# Patient Record
Sex: Male | Born: 1975
Health system: Southern US, Community
[De-identification: ages and names within clinical notes are randomized; demographics above are authoritative.]

## PROBLEM LIST (undated history)

## (undated) DIAGNOSIS — F1010 Alcohol abuse, uncomplicated: Secondary | ICD-10-CM

## (undated) DIAGNOSIS — R569 Unspecified convulsions: Secondary | ICD-10-CM

## (undated) HISTORY — PX: OTHER SURGICAL HISTORY: SHX169

## (undated) HISTORY — PX: HERNIA REPAIR: SHX51

## (undated) NOTE — *Deleted (*Deleted)
   07/10/20 2113  TOC ED Mini Assessment  TOC Time spent with patient (minutes): 25  PING Used in TOC Assessment No  Admission or Readmission Diverted Yes  Interventions which prevented an admission or readmission Medication Review;PCP Appointment Scheduled  What brought you to the Emergency Department?  seizures  Barrier interventions medication assistance, Assistance with PCP

---

## 1998-12-21 ENCOUNTER — Emergency Department (HOSPITAL_COMMUNITY): Admission: EM | Admit: 1998-12-21 | Discharge: 1998-12-21 | Payer: Self-pay | Admitting: Emergency Medicine

## 1998-12-22 ENCOUNTER — Encounter: Payer: Self-pay | Admitting: Internal Medicine

## 1998-12-22 ENCOUNTER — Ambulatory Visit (HOSPITAL_COMMUNITY): Admission: RE | Admit: 1998-12-22 | Discharge: 1998-12-22 | Payer: Self-pay | Admitting: Internal Medicine

## 1998-12-24 ENCOUNTER — Emergency Department (HOSPITAL_COMMUNITY): Admission: EM | Admit: 1998-12-24 | Discharge: 1998-12-24 | Payer: Self-pay | Admitting: Emergency Medicine

## 1999-01-02 ENCOUNTER — Ambulatory Visit (HOSPITAL_COMMUNITY): Admission: RE | Admit: 1999-01-02 | Discharge: 1999-01-02 | Payer: Self-pay | Admitting: Internal Medicine

## 1999-01-02 ENCOUNTER — Encounter: Payer: Self-pay | Admitting: Internal Medicine

## 1999-03-05 ENCOUNTER — Emergency Department (HOSPITAL_COMMUNITY): Admission: EM | Admit: 1999-03-05 | Discharge: 1999-03-05 | Payer: Self-pay | Admitting: *Deleted

## 1999-03-12 ENCOUNTER — Emergency Department (HOSPITAL_COMMUNITY): Admission: EM | Admit: 1999-03-12 | Discharge: 1999-03-12 | Payer: Self-pay | Admitting: Emergency Medicine

## 1999-09-20 ENCOUNTER — Emergency Department (HOSPITAL_COMMUNITY): Admission: EM | Admit: 1999-09-20 | Discharge: 1999-09-20 | Payer: Self-pay | Admitting: Emergency Medicine

## 1999-10-20 ENCOUNTER — Encounter: Payer: Self-pay | Admitting: Emergency Medicine

## 1999-10-20 ENCOUNTER — Emergency Department (HOSPITAL_COMMUNITY): Admission: EM | Admit: 1999-10-20 | Discharge: 1999-10-20 | Payer: Self-pay | Admitting: Emergency Medicine

## 1999-12-29 ENCOUNTER — Emergency Department (HOSPITAL_COMMUNITY): Admission: EM | Admit: 1999-12-29 | Discharge: 1999-12-29 | Payer: Self-pay

## 2000-01-22 ENCOUNTER — Encounter: Payer: Self-pay | Admitting: *Deleted

## 2000-01-22 ENCOUNTER — Emergency Department (HOSPITAL_COMMUNITY): Admission: EM | Admit: 2000-01-22 | Discharge: 2000-01-22 | Payer: Self-pay | Admitting: *Deleted

## 2000-04-09 ENCOUNTER — Emergency Department (HOSPITAL_COMMUNITY): Admission: EM | Admit: 2000-04-09 | Discharge: 2000-04-09 | Payer: Self-pay | Admitting: Emergency Medicine

## 2000-05-31 ENCOUNTER — Emergency Department (HOSPITAL_COMMUNITY): Admission: EM | Admit: 2000-05-31 | Discharge: 2000-05-31 | Payer: Self-pay | Admitting: Emergency Medicine

## 2000-07-01 ENCOUNTER — Emergency Department (HOSPITAL_COMMUNITY): Admission: EM | Admit: 2000-07-01 | Discharge: 2000-07-01 | Payer: Self-pay

## 2001-06-02 ENCOUNTER — Encounter: Payer: Self-pay | Admitting: Sports Medicine

## 2001-06-02 ENCOUNTER — Ambulatory Visit (HOSPITAL_COMMUNITY): Admission: RE | Admit: 2001-06-02 | Discharge: 2001-06-02 | Payer: Self-pay | Admitting: Sports Medicine

## 2001-09-13 ENCOUNTER — Emergency Department (HOSPITAL_COMMUNITY): Admission: EM | Admit: 2001-09-13 | Discharge: 2001-09-13 | Payer: Self-pay | Admitting: Emergency Medicine

## 2001-09-13 ENCOUNTER — Encounter: Payer: Self-pay | Admitting: Emergency Medicine

## 2003-04-13 ENCOUNTER — Emergency Department (HOSPITAL_COMMUNITY): Admission: EM | Admit: 2003-04-13 | Discharge: 2003-04-13 | Payer: Self-pay | Admitting: Emergency Medicine

## 2003-04-17 ENCOUNTER — Emergency Department (HOSPITAL_COMMUNITY): Admission: EM | Admit: 2003-04-17 | Discharge: 2003-04-17 | Payer: Self-pay | Admitting: Emergency Medicine

## 2003-09-16 ENCOUNTER — Emergency Department (HOSPITAL_COMMUNITY): Admission: EM | Admit: 2003-09-16 | Discharge: 2003-09-16 | Payer: Self-pay | Admitting: Emergency Medicine

## 2003-12-01 ENCOUNTER — Ambulatory Visit (HOSPITAL_BASED_OUTPATIENT_CLINIC_OR_DEPARTMENT_OTHER): Admission: RE | Admit: 2003-12-01 | Discharge: 2003-12-01 | Payer: Self-pay | Admitting: Orthopedic Surgery

## 2003-12-15 ENCOUNTER — Emergency Department (HOSPITAL_COMMUNITY): Admission: EM | Admit: 2003-12-15 | Discharge: 2003-12-15 | Payer: Self-pay | Admitting: Emergency Medicine

## 2009-07-10 ENCOUNTER — Emergency Department (HOSPITAL_COMMUNITY): Admission: EM | Admit: 2009-07-10 | Discharge: 2009-07-10 | Payer: Self-pay | Admitting: Emergency Medicine

## 2010-11-21 LAB — GC/CHLAMYDIA PROBE AMP, URINE: GC Probe Amp, Urine: NEGATIVE

## 2011-01-04 NOTE — Op Note (Signed)
Michael Hobbs, Michael Hobbs NO.:  [id number]   MEDICAL RECORD NO.:  [id number]                   PATIENT TYPE:  AMB   LOCATION:  DSC                                  FACILITY:  [facility]   PHYSICIAN:  Loreta Ave, M.D.              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   POSTOPERATIVE DIAGNOSES:  1. 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  2. Extensive tearing 50% integrity of the long head biceps tendon intra-     articular portion.   PROCEDURE:  1. Left shoulder examination under anesthesia.  2. Arthroscopy.  3. Debridement of rotator cuff above and below.  4. Debridement of extensive partial tearing along the biceps tendon.  5. Bursectomy.  6. Acromioplasty.  7. CA ligament release.  8. Excision distal clavicle.   SURGEON:  Loreta Ave, M.D.   ASSISTANT:  Arlys John D. Petrarca, P.A.-C.   ANESTHESIA:  General.   ESTIMATED BLOOD LOSS:  Minimal.   SPECIMENS:  None.   CULTURES:  None.   COMPLICATIONS:  None.   DRESSING:  Soft compressive with sling.   DESCRIPTION OF PROCEDURE:  The patient was brought to the operating room and  placed on the operating table in the supine position.  After adequate  anesthesia had been obtained, the left shoulder examined.  Full motion and  good stability.  Placed in the beach chair position on the shoulder  positioner.  Prepped and draped in the usual sterile fashion.  Three  standard portals - anterior, posterior, and lateral.  Shoulder entered with  blunt obturator, distended, and inspected.  Extensive partial thickness  tearing with delamination of the long head biceps tendon where about 50% of  the tendon was torn  longitudinally and a portion of this detached from the  glenoid.  All of this debrided back healthy tissue leaving more than 50% of  the tendon intact with a good anchor on the glenoid and still functional  going through the biceps hiatus.  I had enough left that I elected to leave  this in place and not resect it for an open tenodesis.  Partial thickness  tearing on the undersurface of the cuff debrided.  Some synovitis debrided.  Remaining articular cartilage, capsular ligamentous structures, and labrum  intact.  Cannuli redirected subacromially.  Type II to III acromion, obvious  subacromial impingement with a __________ on top of the cuff, and a  thickened bursa.  Cuff debrided, bursa resected.  Acromioplasty to a type I  acromion with shaver and high speed bur.  CA ligament released with cautery.  Distal clavicle exposed.  Osteolysis with grade IV  changes.  Lateral cm  sharply resected.  At completion, the entire cuff was debrided and  thoroughly inspected and intact with no full thickness tears.  Adequacy of  acromioplasty and distal clavicle excision confirmed view with small  portals.  The instruments and fluid removed.  The portals of the shoulder  and bursa injected with Marcaine.  Portals were closed with 4-0 nylon.  Sterile compressive dressing applied.  Anesthesia reversed.  Brought to the  recovery room.  Tolerated the procedure well with no complications.                                               Loreta Ave, M.D.    DFM/MEDQ  D:  12/01/2003  T:  12/02/2003  Job:  [id number]

## 2012-11-30 ENCOUNTER — Encounter (HOSPITAL_COMMUNITY): Payer: Self-pay | Admitting: *Deleted

## 2012-11-30 ENCOUNTER — Emergency Department (HOSPITAL_COMMUNITY): Payer: Medicaid - Out of State

## 2012-11-30 ENCOUNTER — Inpatient Hospital Stay (HOSPITAL_COMMUNITY)
Admission: EM | Admit: 2012-11-30 | Discharge: 2012-12-02 | DRG: 086 | Discharge status: Against Medical Advice | Payer: Medicaid - Out of State | Attending: Internal Medicine | Admitting: Internal Medicine

## 2012-11-30 DIAGNOSIS — D696 Thrombocytopenia, unspecified: Secondary | ICD-10-CM | POA: Diagnosis present

## 2012-11-30 DIAGNOSIS — F1011 Alcohol abuse, in remission: Secondary | ICD-10-CM | POA: Diagnosis present

## 2012-11-30 DIAGNOSIS — S022XXB Fracture of nasal bones, initial encounter for open fracture: Secondary | ICD-10-CM

## 2012-11-30 DIAGNOSIS — R569 Unspecified convulsions: Secondary | ICD-10-CM | POA: Diagnosis present

## 2012-11-30 DIAGNOSIS — R55 Syncope and collapse: Secondary | ICD-10-CM

## 2012-11-30 DIAGNOSIS — F10939 Alcohol use, unspecified with withdrawal, unspecified: Secondary | ICD-10-CM | POA: Diagnosis present

## 2012-11-30 DIAGNOSIS — F1020 Alcohol dependence, uncomplicated: Secondary | ICD-10-CM | POA: Diagnosis present

## 2012-11-30 DIAGNOSIS — F10239 Alcohol dependence with withdrawal, unspecified: Secondary | ICD-10-CM | POA: Diagnosis present

## 2012-11-30 DIAGNOSIS — Y92009 Unspecified place in unspecified non-institutional (private) residence as the place of occurrence of the external cause: Secondary | ICD-10-CM

## 2012-11-30 DIAGNOSIS — W19XXXA Unspecified fall, initial encounter: Secondary | ICD-10-CM | POA: Diagnosis present

## 2012-11-30 DIAGNOSIS — Z91199 Patient's noncompliance with other medical treatment and regimen due to unspecified reason: Secondary | ICD-10-CM

## 2012-11-30 DIAGNOSIS — Z823 Family history of stroke: Secondary | ICD-10-CM

## 2012-11-30 DIAGNOSIS — F17200 Nicotine dependence, unspecified, uncomplicated: Secondary | ICD-10-CM | POA: Diagnosis present

## 2012-11-30 DIAGNOSIS — S0512XA Contusion of eyeball and orbital tissues, left eye, initial encounter: Secondary | ICD-10-CM

## 2012-11-30 DIAGNOSIS — R030 Elevated blood-pressure reading, without diagnosis of hypertension: Secondary | ICD-10-CM | POA: Diagnosis present

## 2012-11-30 DIAGNOSIS — S066X9A Traumatic subarachnoid hemorrhage with loss of consciousness of unspecified duration, initial encounter: Secondary | ICD-10-CM

## 2012-11-30 DIAGNOSIS — Z9119 Patient's noncompliance with other medical treatment and regimen: Secondary | ICD-10-CM

## 2012-11-30 DIAGNOSIS — S0010XA Contusion of unspecified eyelid and periocular area, initial encounter: Secondary | ICD-10-CM | POA: Diagnosis present

## 2012-11-30 DIAGNOSIS — S022XXA Fracture of nasal bones, initial encounter for closed fracture: Secondary | ICD-10-CM

## 2012-11-30 HISTORY — DX: Alcohol abuse, uncomplicated: F10.10

## 2012-11-30 HISTORY — DX: Unspecified convulsions: R56.9

## 2012-11-30 LAB — POCT I-STAT TROPONIN I: Troponin i, poc: 0 ng/mL (ref 0.00–0.08)

## 2012-11-30 LAB — HEPATIC FUNCTION PANEL
Alkaline Phosphatase: 43 U/L (ref 39–117)
Indirect Bilirubin: 0.6 mg/dL (ref 0.3–0.9)
Total Bilirubin: 0.7 mg/dL (ref 0.3–1.2)
Total Protein: 7.6 g/dL (ref 6.0–8.3)

## 2012-11-30 LAB — COMPREHENSIVE METABOLIC PANEL WITH GFR
ALT: 28 U/L (ref 0–53)
AST: 60 U/L — ABNORMAL HIGH (ref 0–37)
Albumin: 4.4 g/dL (ref 3.5–5.2)
CO2: 29 meq/L (ref 19–32)
Calcium: 9.6 mg/dL (ref 8.4–10.5)
Chloride: 96 meq/L (ref 96–112)
GFR calc non Af Amer: >90 mL/min (ref >90)
Sodium: 138 meq/L (ref 135–145)

## 2012-11-30 LAB — CBC WITH DIFFERENTIAL/PLATELET
Basophils Absolute: 0 K/uL (ref 0.0–0.1)
Basophils Relative: 0 % (ref 0–1)
Eosinophils Absolute: 0 10*3/uL (ref 0.0–0.7)
Eosinophils Relative: 0 % (ref 0–5)
HCT: 38.8 % — ABNORMAL LOW (ref 39.0–52.0)
Hemoglobin: 13.8 g/dL (ref 13.0–17.0)
Lymphocytes Relative: 13 % (ref 12–46)
Lymphs Abs: 0.7 10*3/uL (ref 0.7–4.0)
MCH: 35.4 pg — ABNORMAL HIGH (ref 26.0–34.0)
MCHC: 35.6 g/dL (ref 30.0–36.0)
MCV: 99.5 fL (ref 78.0–100.0)
Monocytes Absolute: 0.5 K/uL (ref 0.1–1.0)
Monocytes Relative: 10 % (ref 3–12)
Neutro Abs: 4.2 10*3/uL (ref 1.7–7.7)
Neutrophils Relative %: 77 % (ref 43–77)
Platelets: 68 K/uL — ABNORMAL LOW (ref 150–400)
RBC: 3.9 MIL/uL — ABNORMAL LOW (ref 4.22–5.81)
RDW: 12.1 % (ref 11.5–15.5)
WBC: 5.4 K/uL (ref 4.0–10.5)

## 2012-11-30 LAB — ETHANOL: Alcohol, Ethyl (B): <11 mg/dL (ref 0–11)

## 2012-11-30 LAB — COMPREHENSIVE METABOLIC PANEL
Alkaline Phosphatase: 43 U/L (ref 39–117)
BUN: 5 mg/dL — ABNORMAL LOW (ref 6–23)
Creatinine, Ser: 0.82 mg/dL (ref 0.50–1.35)
GFR calc Af Amer: >90 mL/min (ref >90)
Glucose, Bld: 94 mg/dL (ref 70–99)
Potassium: 3.6 mEq/L (ref 3.5–5.1)
Total Bilirubin: 0.7 mg/dL (ref 0.3–1.2)
Total Protein: 7.6 g/dL (ref 6.0–8.3)

## 2012-11-30 LAB — APTT: aPTT: 34 seconds (ref 24–37)

## 2012-11-30 LAB — PROTIME-INR: Prothrombin Time: 13.5 seconds (ref 11.6–15.2)

## 2012-11-30 MED ORDER — THIAMINE HCL 100 MG/ML IJ SOLN
Freq: Once | INTRAVENOUS | Status: AC
  Administered 2012-11-30: 19:00:00 via INTRAVENOUS
  Filled 2012-11-30: qty 1000

## 2012-11-30 MED ORDER — MORPHINE SULFATE 4 MG/ML IJ SOLN
2.0000 mg | Freq: Once | INTRAMUSCULAR | Status: AC
  Administered 2012-11-30: 2 mg via INTRAVENOUS
  Filled 2012-11-30: qty 1

## 2012-11-30 MED ORDER — LORAZEPAM 2 MG/ML IJ SOLN
1.0000 mg | Freq: Once | INTRAMUSCULAR | Status: AC
  Administered 2012-11-30: 1 mg via INTRAVENOUS
  Filled 2012-11-30: qty 1

## 2012-11-30 NOTE — ED Notes (Signed)
Patient with facial swelling on left side and nasal swelling due to syncopal episode.  Patient also c/o neck pain and right shoulder pain.

## 2012-11-30 NOTE — ED Notes (Addendum)
Patient has history of seizures four years ago.  Does not take seizure medication.

## 2012-11-30 NOTE — Progress Notes (Signed)
Noted pt without pcp for follow up care CM spoke with pt and male family member at his bedside. Although pt is listed in [facility] with a [address] [place] [address] Address, CM was informed that the pt is visiting from [place] and will not need follow pcp. CM did offer a list of urgent care/primare care services in [place] for follow care prn

## 2012-11-30 NOTE — ED Notes (Signed)
Per EMS, pt from home, reports syncopal episode at 1030-1100 while in the kitchen.  Pt's family found pt on the floor at 1400 with blood.  Small abrasion noted on bridge of his nose.

## 2012-11-30 NOTE — ED Notes (Signed)
Patient transported to CT 

## 2012-11-30 NOTE — ED Notes (Addendum)
iSTat Trop 0.00, [facility] communication down.

## 2012-11-30 NOTE — ED Provider Notes (Signed)
History     CSN: [id number]  Arrival date & time 11/30/12  1437   First MD Initiated Contact with Patient 11/30/12 1750      Chief Complaint  Patient presents with  . Loss of Consciousness    (Consider location/radiation/quality/duration/timing/severity/associated sxs/prior treatment) HPI  Patient is a 37 year old male past medical history significant for alcohol abuse and alcohol withdrawal seizures (four years ago) presenting to ED after loss of consciousness without aura episode this morning while cooking. First thing patient remembers after he passed out is sitting on the couch waiting for EMS that his mother called. Family states found patient in bathroom cleaning himself up and he had dry blood on the front of his shirt and from his nose. And patient typically drinks 2 "large" Coors along with a pint of hard liquor daily for years. Patient has not had a drink since Saturday. Denies any recent illnesses, fevers, chills, nausea, vomiting, abdominal pain, chest pain, shortness of breath or cough. Denies recreational drug use.  Past Medical History  Diagnosis Date  . Alcohol abuse     History reviewed. No pertinent past surgical history.  No family history on file.  History  Substance Use Topics  . Smoking status: Current Every Day Smoker  . Smokeless tobacco: Not on file  . Alcohol Use: Yes      Review of Systems  Constitutional: Negative for fever and chills.  HENT: Positive for facial swelling, neck pain and neck stiffness. Negative for ear pain, sore throat and trouble swallowing.   Eyes: Positive for visual disturbance.  Respiratory: Negative for cough, chest tightness and shortness of breath.   Cardiovascular: Negative for chest pain and palpitations.  Gastrointestinal: Negative for nausea, vomiting and abdominal pain.  Genitourinary: Negative for dysuria and difficulty urinating.  Musculoskeletal: Positive for myalgias.  Skin: Positive for color change.    Neurological: Positive for headaches.  Psychiatric/Behavioral: Negative for confusion and decreased concentration. The patient is not nervous/anxious.     Allergies  Review of patient's allergies indicates no known allergies.  Home Medications  No current outpatient prescriptions on file.  BP 152/93  Pulse 85  Temp(Src) 99.2 F (37.3 C) (Oral)  Resp 16  SpO2 95%  Physical Exam  Constitutional: He is oriented to person, place, and time. He appears well-developed and well-nourished. No distress.  HENT:  Head: Normocephalic.    Right Ear: Hearing, tympanic membrane, external ear and ear canal normal.  Left Ear: Hearing, tympanic membrane, external ear and ear canal normal.  Nose: Nose lacerations present. No nasal deformity. No epistaxis.  Mouth/Throat: Uvula is midline, oropharynx is clear and moist and mucous membranes are normal. No oropharyngeal exudate.  Small abrasion bridge of nose not bleeding  Eyes:  Left eye is closed and d/t hematoma. Right eye: Pupils equal round reactive to light EOMI   Neck: Trachea normal and normal range of motion. Neck supple. Spinous process tenderness and muscular tenderness present. No rigidity. No tracheal deviation, no edema, no erythema and normal range of motion present.  Cardiovascular: Normal rate, regular rhythm, normal heart sounds and intact distal pulses.   Pulmonary/Chest: Effort normal and breath sounds normal. No respiratory distress. He has no wheezes. He exhibits no tenderness.  Abdominal: Soft. Bowel sounds are normal. There is no tenderness.  Musculoskeletal:       Right shoulder: He exhibits decreased range of motion, tenderness and pain. He exhibits no swelling, no deformity, normal pulse and normal strength.  Left shoulder: Normal.       Cervical back: He exhibits tenderness and bony tenderness. He exhibits normal range of motion.  Lymphadenopathy:    He has no cervical adenopathy.  Neurological: He is alert and  oriented to person, place, and time. He displays tremor. No sensory deficit. GCS eye subscore is 4. GCS verbal subscore is 5. GCS motor subscore is 6.  Skin: Skin is warm and dry. He is not diaphoretic.  Psychiatric: He has a normal mood and affect. His speech is normal and behavior is normal. Judgment and thought content normal. Cognition and memory are normal.    ED Course  Procedures (including critical care time)  Labs Reviewed  COMPREHENSIVE METABOLIC PANEL - Abnormal; Notable for the following:    BUN 5 (*)    AST 60 (*)    All other components within normal limits  ETHANOL  CBC WITH DIFFERENTIAL  HEPATIC FUNCTION PANEL  APTT  PROTIME-INR  POCT I-STAT TROPONIN I  POCT CBG (FASTING - GLUCOSE)-MANUAL ENTRY   Dg Facial Bones Complete  11/30/2012  *RADIOLOGY REPORT*  Clinical Data: 37 year old male with loss of consciousness.  Fall. Pain.  FACIAL BONES COMPLETE 3+V  Comparison: None.  Findings: Bone mineralization is within normal limits.  Paranasal sinuses appear normally pneumatized.  No abnormal intraorbital gas identified.  Mandible appears intact.  IMPRESSION: No plain radiographic evidence of acute facial fracture. If suspicion persists CT of the face would be more sensitive.   Original Report Authenticated By: Erskine Speed, M.D.    Dg Cervical Spine Complete  11/30/2012  *RADIOLOGY REPORT*  Clinical Data: 37 year old male with loss of consciousness.  Fall. Pain.  CERVICAL SPINE - COMPLETE 4+ VIEW  Comparison: None.  Findings: Reversed cervical lordosis.  Prevertebral soft tissue contour within normal limits. Cervicothoracic junction alignment is within normal limits.  Mild endplate spurring from C4 to the C6 level. Bilateral posterior element alignment is within normal limits.  AP alignment and lung apices within normal limits.  C1-C2 alignment and odontoid within normal limits.  IMPRESSION: No acute fracture or listhesis identified in the cervical spine. Ligamentous injury is not  excluded.   Original Report Authenticated By: Erskine Speed, M.D.    Dg Shoulder Right  11/30/2012  *RADIOLOGY REPORT*  Clinical Data: Fall, shoulder pain  RIGHT SHOULDER - 2+ VIEW  Comparison: None  Findings: No acute fracture or malalignment.  The humeral head is located with respect to the glenoid on the scapular Y view.  The acromioclavicular joint remains congruent.  No acute rib fracture identified.  Visualized lung is clear.  IMPRESSION: Negative right shoulder   Original Report Authenticated By: Malachy Moan, M.D.    Ct Head Wo Contrast  11/30/2012  *RADIOLOGY REPORT*  Clinical Data: Loss of consciousness.  CT HEAD WITHOUT CONTRAST  Technique:  Contiguous axial images were obtained from the base of the skull through the vertex without contrast.  Comparison: None  Findings: There is a small focal blood collection, most likely in the subarachnoid space around the left aspect of the brainstem near the tectum.  I do not see any other areas of subarachnoid hemorrhage or subdural hematoma.  The ventricles are normal in size and configuration.  The gray-white differentiation is maintained.  No acute skull fracture is identified.  There is a fracture of the bony nasal bridge.  There is soft tissue swelling around the left orbit.  The globes are intact.  The paranasal sinuses and mastoid air cells are clear  except for a small amount of fluid in the left maxillary sinus and in the right half of the sphenoid sinus.  IMPRESSION:  1.  Small focus of subarachnoid hemorrhage around the left side of the brainstem and tectum.  Recommend observation  and follow-up CT scan. 2.  No acute skull fracture. 3.  Nasal bone fractures.   Original Report Authenticated By: Rudie Meyer, M.D.      1. Subarachnoid hematoma, initial encounter   2. Syncope   3. Periorbital contusion of left eye, initial encounter   4. Nasal fracture, closed, initial encounter       MDM  Patient is a 37 year old male past medical history  significant for alcohol abuse presenting to ED after a loss of consciousness with fall that occurred earlier this afternoon. Loss of consciousness was unwitnessed, family noted patient cleaning himself up in the bathroom. Patient is in no neural focal deficits on examination, GCS 15, no other signs of neurologic damage. X-rays noted no acute fractures in the cervical spine were facial bones, no fracture or misalignment or dislocation noted on right shoulder x-ray. CT noncontrast of the head showed small focus of subarachnoid hemorrhage from left-sided brainstem with no acute skull fracture. As the fall was unwitnessed and the subarachnoid hemorrhage can not be determined if it caused the fall or was the result of the fall neurosurgery will be consulted. Hospitalist advised. Patient will be admitted for further evaluation and repeat CT of the head. Patient agreeable to plan. Patient d/w with Dr. Oletta Lamas, agrees with plan. Patient is stable and ready for transport.          Jeannetta Ellis, PA-C 12/01/12 0132

## 2012-12-01 ENCOUNTER — Inpatient Hospital Stay (HOSPITAL_COMMUNITY): Payer: Medicaid - Out of State

## 2012-12-01 ENCOUNTER — Other Ambulatory Visit (HOSPITAL_COMMUNITY): Payer: Self-pay

## 2012-12-01 ENCOUNTER — Encounter (HOSPITAL_COMMUNITY): Payer: Self-pay | Admitting: *Deleted

## 2012-12-01 DIAGNOSIS — S022XXB Fracture of nasal bones, initial encounter for open fracture: Secondary | ICD-10-CM

## 2012-12-01 DIAGNOSIS — R55 Syncope and collapse: Secondary | ICD-10-CM

## 2012-12-01 DIAGNOSIS — S022XXA Fracture of nasal bones, initial encounter for closed fracture: Secondary | ICD-10-CM

## 2012-12-01 DIAGNOSIS — S066X9A Traumatic subarachnoid hemorrhage with loss of consciousness of unspecified duration, initial encounter: Secondary | ICD-10-CM

## 2012-12-01 DIAGNOSIS — F1020 Alcohol dependence, uncomplicated: Secondary | ICD-10-CM

## 2012-12-01 DIAGNOSIS — F1011 Alcohol abuse, in remission: Secondary | ICD-10-CM | POA: Diagnosis present

## 2012-12-01 DIAGNOSIS — S0512XA Contusion of eyeball and orbital tissues, left eye, initial encounter: Secondary | ICD-10-CM

## 2012-12-01 DIAGNOSIS — S0510XA Contusion of eyeball and orbital tissues, unspecified eye, initial encounter: Secondary | ICD-10-CM

## 2012-12-01 DIAGNOSIS — S066XAA Traumatic subarachnoid hemorrhage with loss of consciousness status unknown, initial encounter: Secondary | ICD-10-CM

## 2012-12-01 HISTORY — DX: Traumatic subarachnoid hemorrhage with loss of consciousness of unspecified duration, initial encounter: S06.6X9A

## 2012-12-01 HISTORY — DX: Syncope and collapse: R55

## 2012-12-01 HISTORY — DX: Traumatic subarachnoid hemorrhage with loss of consciousness status unknown, initial encounter: S06.6XAA

## 2012-12-01 HISTORY — DX: Fracture of nasal bones, initial encounter for closed fracture: S02.2XXA

## 2012-12-01 HISTORY — DX: Contusion of eyeball and orbital tissues, left eye, initial encounter: S05.12XA

## 2012-12-01 LAB — CBC
Platelets: 63 10*3/uL — ABNORMAL LOW (ref 150–400)
RDW: 12.1 % (ref 11.5–15.5)
WBC: 5.3 10*3/uL (ref 4.0–10.5)

## 2012-12-01 LAB — BASIC METABOLIC PANEL
Calcium: 9.6 mg/dL (ref 8.4–10.5)
Chloride: 100 mEq/L (ref 96–112)
Creatinine, Ser: 0.9 mg/dL (ref 0.50–1.35)
GFR calc Af Amer: >90 mL/min (ref >90)

## 2012-12-01 LAB — MRSA PCR SCREENING: MRSA by PCR: NEGATIVE

## 2012-12-01 LAB — MAGNESIUM: Magnesium: 1.8 mg/dL (ref 1.5–2.5)

## 2012-12-01 MED ORDER — ACETAMINOPHEN 650 MG RE SUPP: 650.0000 mg | Freq: Four times a day (QID) | RECTAL | Status: DC | PRN

## 2012-12-01 MED ORDER — SODIUM CHLORIDE 0.9 % IV SOLN
INTRAVENOUS | Status: DC
  Administered 2012-12-01 (×2): via INTRAVENOUS

## 2012-12-01 MED ORDER — LORAZEPAM 1 MG PO TABS: 0.0000 mg | ORAL_TABLET | Freq: Two times a day (BID) | ORAL | Status: DC

## 2012-12-01 MED ORDER — SODIUM CHLORIDE 0.9 % IJ SOLN
3.0000 mL | Freq: Two times a day (BID) | INTRAMUSCULAR | Status: DC
  Administered 2012-12-01: 3 mL via INTRAVENOUS

## 2012-12-01 MED ORDER — HYDRALAZINE HCL 20 MG/ML IJ SOLN: 10.0000 mg | INTRAMUSCULAR | Status: DC | PRN

## 2012-12-01 MED ORDER — ACETAMINOPHEN 325 MG PO TABS
650.0000 mg | ORAL_TABLET | Freq: Four times a day (QID) | ORAL | Status: DC | PRN
  Administered 2012-12-01: 650 mg via ORAL
  Filled 2012-12-01: qty 2

## 2012-12-01 MED ORDER — ADULT MULTIVITAMIN W/MINERALS CH
1.0000 | ORAL_TABLET | Freq: Every day | ORAL | Status: DC
  Administered 2012-12-01: 1 via ORAL
  Filled 2012-12-01 (×2): qty 1

## 2012-12-01 MED ORDER — LORAZEPAM 2 MG/ML IJ SOLN
1.0000 mg | Freq: Four times a day (QID) | INTRAMUSCULAR | Status: DC | PRN
  Administered 2012-12-01: 1 mg via INTRAVENOUS
  Filled 2012-12-01: qty 1

## 2012-12-01 MED ORDER — FOLIC ACID 1 MG PO TABS
1.0000 mg | ORAL_TABLET | Freq: Every day | ORAL | Status: DC
  Administered 2012-12-01: 1 mg via ORAL
  Filled 2012-12-01 (×2): qty 1

## 2012-12-01 MED ORDER — IOHEXOL 350 MG/ML SOLN
50.0000 mL | Freq: Once | INTRAVENOUS | Status: AC | PRN
  Administered 2012-12-01: 50 mL via INTRAVENOUS

## 2012-12-01 MED ORDER — NICOTINE 7 MG/24HR TD PT24
7.0000 mg | MEDICATED_PATCH | Freq: Every day | TRANSDERMAL | Status: DC
  Administered 2012-12-01: 7 mg via TRANSDERMAL
  Filled 2012-12-01 (×3): qty 1

## 2012-12-01 MED ORDER — VITAMIN B-1 100 MG PO TABS
100.0000 mg | ORAL_TABLET | Freq: Every day | ORAL | Status: DC
  Administered 2012-12-01: 100 mg via ORAL
  Filled 2012-12-01 (×2): qty 1

## 2012-12-01 MED ORDER — ONDANSETRON HCL 4 MG PO TABS: 4.0000 mg | ORAL_TABLET | Freq: Four times a day (QID) | ORAL | Status: DC | PRN

## 2012-12-01 MED ORDER — LORAZEPAM 1 MG PO TABS: 1.0000 mg | ORAL_TABLET | Freq: Four times a day (QID) | ORAL | Status: DC | PRN

## 2012-12-01 MED ORDER — ONDANSETRON HCL 4 MG/2ML IJ SOLN: 4.0000 mg | Freq: Four times a day (QID) | INTRAMUSCULAR | Status: DC | PRN

## 2012-12-01 MED ORDER — LORAZEPAM 1 MG PO TABS
0.0000 mg | ORAL_TABLET | Freq: Four times a day (QID) | ORAL | Status: DC
  Administered 2012-12-01 (×2): 1 mg via ORAL
  Filled 2012-12-01: qty 1
  Filled 2012-12-01: qty 2

## 2012-12-01 MED ORDER — THIAMINE HCL 100 MG/ML IJ SOLN
100.0000 mg | Freq: Every day | INTRAMUSCULAR | Status: DC
  Filled 2012-12-01: qty 1

## 2012-12-01 NOTE — ED Provider Notes (Signed)
Medical screening examination/treatment/procedure(s) were conducted as a shared visit with non-physician practitioner(s) and myself.  I personally evaluated the patient during the encounter  Pt with un witnessed fall at home with family.  Pt found by family in bathroom trying to clean up.  Pt with obvious head injury from fall, contusion to periorbial region, swelling to bridge of nose.  Pt has no recollection of fall.  Pt with prior h/o alcohol withdrawal seizure.  Pt admits, hasn't had any drinks recently, ETOH is <11 here.  Head CT shows small amount of SAH.  Per Dr. Dutch Quint who reviewed CT scan, he recommends no surgical intervention needed, needs a repeat head CT in 24 hours to ensure no worsening is occurring.    Given possible seizure versus syncope, will admit to medicine and head CT can be repeated in the AM.  Pt can get outpt follow up with ENT/Maxilofacial as outpt for his nasal bone fractures.      Impression: Probably traumatic SAH Seizure versus syncope Facial contusions Nasal fractures   Gavin Pound. Oletta Lamas, MD 12/01/12 1610

## 2012-12-01 NOTE — Progress Notes (Signed)
I have seen and assessed the patient and agree with Dr Katherene Ponto assessment and plan. Will check a 2 d echo and carotid dopplers. Will consult neuro and transfer to [facility], per recommendations. Follow.

## 2012-12-01 NOTE — Consult Note (Signed)
Referring Physician: Janee Morn    Chief Complaint: SAH/SZ  HPI:                                                                                                                                         Michael Hobbs is an 37 y.o. male who had a seizure about 6 years ago. He describes that event as "family noted I was zoned out and not acting right. I was brought to the ED where they looked me over and then sent me home".  He denies any further workup or AED started at that time.He came to [place] to visit his mother and family  He states he drank the day of even (12 oz beer) and the day before he had a 60 oz beer and a shot hard alcohol. On the day of event he only recalls going into the kitchen cooking food. He then recalls his sister helping out of the bathroom and he had blood all over his face.  He currently would like to go home and feels there is no reason for him to stay. He is alert and oriented and follows all commands.   Date last known well: 4.14.14 Time last known well: unknown tPA Given: No: bleed  Past Medical History  Diagnosis Date  . Alcohol abuse     fell in 2006 and had "brain bleed"  . Seizures     Past Surgical History  Procedure Laterality Date  . Hernia repair    . Right shoulder surgery.      Family History  Problem Relation Age of Onset  . Stroke Father    -   Aneurysm                                                           Sister  Social History:  reports that he has been smoking Cigars.  He does not have any smokeless tobacco history on file. He reports that  drinks alcohol. He reports that he does not use illicit drugs.  Allergies: No Known Allergies  Medications:  Prior to Admission:  No prescriptions prior to admission   Scheduled: . folic acid  1 mg Oral Daily  . LORazepam  0-4 mg Oral Q6H   Followed by  . [START ON 12/03/2012]  LORazepam  0-4 mg Oral Q12H  . multivitamin with minerals  1 tablet Oral Daily  . sodium chloride  3 mL Intravenous Q12H  . thiamine  100 mg Oral Daily   Or  . thiamine  100 mg Intravenous Daily    ROS:                                                                                                                                       History obtained from the patient  General ROS: negative for - chills, fatigue, fever, night sweats, weight gain or weight loss Psychological ROS: negative for - behavioral disorder, hallucinations, memory difficulties, mood swings or suicidal ideation Ophthalmic ROS: negative for - blurry vision, double vision, eye pain or loss of vision ENT ROS: negative for - epistaxis, nasal discharge, oral lesions, sore throat, tinnitus or vertigo Allergy and Immunology ROS: negative for - hives or itchy/watery eyes Hematological and Lymphatic ROS: negative for - bleeding problems, bruising or swollen lymph nodes Endocrine ROS: negative for - galactorrhea, hair pattern changes, polydipsia/polyuria or temperature intolerance Respiratory ROS: negative for - cough, hemoptysis, shortness of breath or wheezing Cardiovascular ROS: negative for - chest pain, dyspnea on exertion, edema or irregular heartbeat Gastrointestinal ROS: negative for - abdominal pain, diarrhea, hematemesis, nausea/vomiting or stool incontinence Genito-Urinary ROS: negative for - dysuria, hematuria, incontinence or urinary frequency/urgency Musculoskeletal ROS: negative for - joint swelling or muscular weakness Neurological ROS: as noted in HPI Dermatological ROS: negative for rash and skin lesion changes  Neurologic Examination:                                                                                                      Blood pressure 149/89, pulse 89, temperature 99.6 F (37.6 C), temperature source Oral, resp. rate 19, height 6' (1.829 m), weight 79.6 kg (175 lb 7.8 oz), SpO2  96.00%.  Mental Status: Alert, oriented, thought content appropriate.  Speech fluent without evidence of aphasia.  Able to follow 3 step commands without difficulty. Cranial Nerves: II: Discs flat bilaterally (unable to see left eye due to swollen shut); Visual fields grossly normal, right pupil, round, reactive to light left pupil unable to visualize III,IV, VI: Left eye is swollen shut, extra-ocular motions left eye intact.  V,VII: smile asymmetric left face due to significant facial edema, facial light touch sensation normal bilaterally VIII: hearing normal bilaterally IX,X: gag reflex present XI: bilateral shoulder shrug XII: midline tongue extension Motor: Right : Upper extremity   5/5    Left:     Upper extremity   5/5  Lower extremity   5/5     Lower extremity   5/5 Tone and bulk:normal tone throughout; no atrophy noted Sensory: Pinprick and light touch intact throughout, bilaterally--He does show decreased cold sensation in bilateral mid calf to foot.  Deep Tendon Reflexes: 2+ and symmetric throughout UE, 1+ KJ and no AJ Plantars: Right: downgoing   Left: downgoing Cerebellar: normal finger-to-nose,  normal heel-to-shin test CV: pulses palpable throughout    Results for orders placed during the hospital encounter of 11/30/12 (from the past 48 hour(s))  CBC WITH DIFFERENTIAL     Status: Abnormal   Collection Time    11/30/12  4:40 PM      Result Value Range   WBC 5.4  4.0 - 10.5 K/uL   RBC 3.90 (*) 4.22 - 5.81 MIL/uL   Hemoglobin 13.8  13.0 - 17.0 g/dL   HCT 96.0 (*) 45.4 - 09.8 %   MCV 99.5  78.0 - 100.0 fL   MCH 35.4 (*) 26.0 - 34.0 pg   MCHC 35.6  30.0 - 36.0 g/dL   RDW 11.9  14.7 - 82.9 %   Platelets 68 (*) 150 - 400 K/uL   Comment: SPECIMEN CHECKED FOR CLOTS     REPEATED TO VERIFY   Neutrophils Relative 77  43 - 77 %   Lymphocytes Relative 13  12 - 46 %   Monocytes Relative 10  3 - 12 %   Eosinophils Relative 0  0 - 5 %   Basophils Relative 0  0 - 1 %    Neutro Abs 4.2  1.7 - 7.7 K/uL   Lymphs Abs 0.7  0.7 - 4.0 K/uL   Monocytes Absolute 0.5  0.1 - 1.0 K/uL   Eosinophils Absolute 0.0  0.0 - 0.7 K/uL   Basophils Absolute 0.0  0.0 - 0.1 K/uL   Smear Review PLATELET COUNT CONFIRMED BY SMEAR    COMPREHENSIVE METABOLIC PANEL     Status: Abnormal   Collection Time    11/30/12  4:40 PM      Result Value Range   Sodium 138  135 - 145 mEq/L   Potassium 3.6  3.5 - 5.1 mEq/L   Chloride 96  96 - 112 mEq/L   CO2 29  19 - 32 mEq/L   Glucose, Bld 94  70 - 99 mg/dL   BUN 5 (*) 6 - 23 mg/dL   Creatinine, Ser 5.62  0.50 - 1.35 mg/dL   Calcium 9.6  8.4 - 13.0 mg/dL   Total Protein 7.6  6.0 - 8.3 g/dL   Albumin 4.4  3.5 - 5.2 g/dL   AST 60 (*) 0 - 37 U/L   ALT 28  0 - 53 U/L   Alkaline Phosphatase 43  39 - 117 U/L   Total Bilirubin 0.7  0.3 - 1.2 mg/dL   GFR calc non Af Amer >90  >90 mL/min   GFR calc Af Amer >90  >90 mL/min   Comment:            The eGFR has been calculated     using the CKD EPI equation.     This calculation has not been  validated in all clinical     situations.     eGFR's persistently     <90 mL/min signify     possible Chronic Kidney Disease.  ETHANOL     Status: None   Collection Time    11/30/12  4:40 PM      Result Value Range   Alcohol, Ethyl (B) <11  0 - 11 mg/dL   Comment:            LOWEST DETECTABLE LIMIT FOR     SERUM ALCOHOL IS 11 mg/dL     FOR MEDICAL PURPOSES ONLY  POCT I-STAT TROPONIN I     Status: None   Collection Time    11/30/12  4:44 PM      Result Value Range   Troponin i, poc 0.00  0.00 - 0.08 ng/mL   Comment 3            Comment: Due to the release kinetics of cTnI,     a negative result within the first hours     of the onset of symptoms does not rule out     myocardial infarction with certainty.     If myocardial infarction is still suspected,     repeat the test at appropriate intervals.  HEPATIC FUNCTION PANEL     Status: Abnormal   Collection Time    11/30/12  7:20 PM       Result Value Range   Total Protein 7.6  6.0 - 8.3 g/dL   Albumin 4.5  3.5 - 5.2 g/dL   AST 60 (*) 0 - 37 U/L   ALT 28  0 - 53 U/L   Alkaline Phosphatase 43  39 - 117 U/L   Total Bilirubin 0.7  0.3 - 1.2 mg/dL   Bilirubin, Direct 0.1  0.0 - 0.3 mg/dL   Indirect Bilirubin 0.6  0.3 - 0.9 mg/dL  APTT     Status: None   Collection Time    11/30/12  7:20 PM      Result Value Range   aPTT 34  24 - 37 seconds  PROTIME-INR     Status: None   Collection Time    11/30/12  7:20 PM      Result Value Range   Prothrombin Time 13.5  11.6 - 15.2 seconds   INR 1.04  0.00 - 1.49  MRSA PCR SCREENING     Status: None   Collection Time    12/01/12  3:59 AM      Result Value Range   MRSA by PCR NEGATIVE  NEGATIVE   Comment:            The GeneXpert MRSA Assay (FDA     approved for NASAL specimens     only), is one component of a     comprehensive MRSA colonization     surveillance program. It is not     intended to diagnose MRSA     infection nor to guide or     monitor treatment for     MRSA infections.  MAGNESIUM     Status: None   Collection Time    12/01/12  5:00 AM      Result Value Range   Magnesium 1.8  1.5 - 2.5 mg/dL  BASIC METABOLIC PANEL     Status: Abnormal   Collection Time    12/01/12  5:10 AM      Result Value Range   Sodium 140  135 - 145  mEq/L   Potassium 3.6  3.5 - 5.1 mEq/L   Chloride 100  96 - 112 mEq/L   CO2 26  19 - 32 mEq/L   Glucose, Bld 91  70 - 99 mg/dL   BUN 4 (*) 6 - 23 mg/dL   Creatinine, Ser 1.61  0.50 - 1.35 mg/dL   Calcium 9.6  8.4 - 09.6 mg/dL   GFR calc non Af Amer >90  >90 mL/min   GFR calc Af Amer >90  >90 mL/min   Comment:            The eGFR has been calculated     using the CKD EPI equation.     This calculation has not been     validated in all clinical     situations.     eGFR's persistently     <90 mL/min signify     possible Chronic Kidney Disease.  CBC     Status: Abnormal   Collection Time    12/01/12  5:10 AM      Result Value  Range   WBC 5.3  4.0 - 10.5 K/uL   RBC 4.07 (*) 4.22 - 5.81 MIL/uL   Hemoglobin 14.5  13.0 - 17.0 g/dL   HCT 04.5  40.9 - 81.1 %   MCV 100.0  78.0 - 100.0 fL   MCH 35.6 (*) 26.0 - 34.0 pg   MCHC 35.6  30.0 - 36.0 g/dL   RDW 91.4  78.2 - 95.6 %   Platelets 63 (*) 150 - 400 K/uL   Comment: CONSISTENT WITH PREVIOUS RESULT   Dg Facial Bones Complete  11/30/2012  *RADIOLOGY REPORT*  Clinical Data: 37 year old male with loss of consciousness.  Fall. Pain.  FACIAL BONES COMPLETE 3+V  Comparison: None.  Findings: Bone mineralization is within normal limits.  Paranasal sinuses appear normally pneumatized.  No abnormal intraorbital gas identified.  Mandible appears intact.  IMPRESSION: No plain radiographic evidence of acute facial fracture. If suspicion persists CT of the face would be more sensitive.   Original Report Authenticated By: Erskine Speed, M.D.    Dg Cervical Spine Complete  11/30/2012  *RADIOLOGY REPORT*  Clinical Data: 37 year old male with loss of consciousness.  Fall. Pain.  CERVICAL SPINE - COMPLETE 4+ VIEW  Comparison: None.  Findings: Reversed cervical lordosis.  Prevertebral soft tissue contour within normal limits. Cervicothoracic junction alignment is within normal limits.  Mild endplate spurring from C4 to the C6 level. Bilateral posterior element alignment is within normal limits.  AP alignment and lung apices within normal limits.  C1-C2 alignment and odontoid within normal limits.  IMPRESSION: No acute fracture or listhesis identified in the cervical spine. Ligamentous injury is not excluded.   Original Report Authenticated By: Erskine Speed, M.D.    Dg Shoulder Right  11/30/2012  *RADIOLOGY REPORT*  Clinical Data: Fall, shoulder pain  RIGHT SHOULDER - 2+ VIEW  Comparison: None  Findings: No acute fracture or malalignment.  The humeral head is located with respect to the glenoid on the scapular Y view.  The acromioclavicular joint remains congruent.  No acute rib fracture identified.   Visualized lung is clear.  IMPRESSION: Negative right shoulder   Original Report Authenticated By: Malachy Moan, M.D.    Ct Head Wo Contrast  11/30/2012  *RADIOLOGY REPORT*  Clinical Data: Loss of consciousness.  CT HEAD WITHOUT CONTRAST  Technique:  Contiguous axial images were obtained from the base of the skull through the vertex without contrast.  Comparison: None  Findings: There is a small focal blood collection, most likely in the subarachnoid space around the left aspect of the brainstem near the tectum.  I do not see any other areas of subarachnoid hemorrhage or subdural hematoma.  The ventricles are normal in size and configuration.  The gray-white differentiation is maintained.  No acute skull fracture is identified.  There is a fracture of the bony nasal bridge.  There is soft tissue swelling around the left orbit.  The globes are intact.  The paranasal sinuses and mastoid air cells are clear except for a small amount of fluid in the left maxillary sinus and in the right half of the sphenoid sinus.  IMPRESSION:  1.  Small focus of subarachnoid hemorrhage around the left side of the brainstem and tectum.  Recommend observation  and follow-up CT scan. 2.  No acute skull fracture. 3.  Nasal bone fractures.   Original Report Authenticated By: Rudie Meyer, M.D.     Assessment and plan discussed with with attending physician and they are in agreement.    Felicie Morn PA-C [facility] [phone]  12/01/2012, 3:49 PM   Patient seen and examined.  Clinical course and management discussed.  Necessary edits performed.  I agree with the above.  Assessment and plan of care developed and discussed below.    Assessment: 37 y.o. male presenting after being found by family having a seizure.  Patient with history of ETOH.  Initial head CT reviewed and shows a small SAH on the left side of the brainstem.  Since patient found down unclear if this is traumatic or related to an aneurysm.  Also  unclear if the patient fell with resultant seizure or had a seizure initially.  Likely related to ETOH withdrawal.  Now slightly agitated.   Stroke Risk Factors - smoking  Plan: 1. HgbA1c, fasting lipid panel 2. MRI of the brain 3. PT consult, OT consult, Speech consult 4. CTA of the head tonight 5. Prophylactic therapy-None 6. BP control - target 160/90 or below 7. Telemetry monitoring 8. Frequent neuro checks 9. CIWA protocol  Case discussed with Dr. Lenda Kelp, MD [facility] [phone]  12/01/2012  4:16 PM

## 2012-12-01 NOTE — Progress Notes (Signed)
Report called to Tammy, RN at [facility] 3100. Pt informed of transfer and that carelink will be called.

## 2012-12-01 NOTE — Progress Notes (Signed)
   CARE MANAGEMENT NOTE 12/01/2012  Patient:  Michael Hobbs,Michael Hobbs   Account Number:  [id number]  Date Initiated:  12/01/2012  Documentation initiated by:  Jiles Crocker  Subjective/Objective Assessment:   ADMITTED WITH Subarachnoid hematoma     Action/Plan:   PATIENT IS VISITING  FROM NEW [place]; INDEPENDENT PRIOR TO ADMISSION; CM WILL CONTINUE TO FOLLOW FOR DCP   Anticipated DC Date:  12/05/2012   Anticipated DC Plan:  HOME/SELF CARE  In-house referral  Financial Counselor      DC Planning Services  CM consult             Status of service:  In process, will continue to follow Medicare Important Message given?  NA - LOS <3 / Initial given by admissions (If response is "NO", the following Medicare IM given date fields will be blank) Per UR Regulation:  Reviewed for med. necessity/level of care/duration of stay Comments:  12/01/2012- B Amardeep Beckers RN,BSN,MHA

## 2012-12-01 NOTE — Progress Notes (Signed)
  Echocardiogram 2D Echocardiogram has been performed.  Ellender Hose A 12/01/2012, 2:11 PM

## 2012-12-01 NOTE — Progress Notes (Signed)
Michael Hobbs to bedside to speak with pt and mother.  Explained to pt/mother that he is unable to leave floor and medical necessity of remaining in hospital.  Pt/mother verbalized understanding.  Pt agreeable to wearing monitor and having iv restarted.  Will continue to monitor.

## 2012-12-01 NOTE — Progress Notes (Signed)
Shift event: RN paged NP 2/2 pt leaving the unit to go smoke. Mother present at bedside and according to RN, went with him. Pt pulled his IV out and took off cardio leads and other lines.  NP to bedside to speak to pt. He was informed it is against policy for him to leave the unit, that he has a serious health issue to warrant him being in step down unit. I explained that for his safety, we need to have IV access and be able to monitor him. He agreed to stay and cooperate with care.  I informed pt that if he intends to leave the unit, he will have to sign out AMA. Security on unit with police but not needed at this time. Jimmye Norman, NP [facility]

## 2012-12-01 NOTE — Progress Notes (Signed)
Pt aggitated, states he wants to smoke a black.  Pulled iv out and cardiac monitor off.  Mother attempted to escort pt off floor to smoke.  Informed pt and mother that he could not leave unit.  Pt stopped at front desk by staff.  Pt back to room.  Pt mother insisting on pt being able to smoke and that he needs care.  Charge nurse sent to room to speak with pt and mother.  Craige Cotta notified of situation.  Order to keep iv out received.  Will attempt to keep pt in room.  Multiple attempts to explain situation to pt and mother.  Unable to keep telemetry on pt.

## 2012-12-01 NOTE — Progress Notes (Signed)
VASCULAR LAB PRELIMINARY  PRELIMINARY  PRELIMINARY  PRELIMINARY  Carotid duplex completed.    Preliminary report:  Bilateral:  No evidence of hemodynamically significant internal carotid artery stenosis.   Vertebral artery flow is antegrade.     Michael Hobbs, RVS 12/01/2012, 12:31 PM

## 2012-12-01 NOTE — H&P (Addendum)
[facility] History and Physical  Michael Hobbs VHQ:[id number] DOB: August 30, 1975 DOA: 11/30/2012  Referring physician: Dr.Ghim PCP: No primary provider on file.  Specialists: None.  Chief Complaint: Loss of consciousness.  HPI: Michael Hobbs is a 37 y.o. male who is visiting from [place] and lives with his family was found to be unconscious in his bathroom today by his family. Patient states does not recall the incident but may have had an early episode just few minutes before that when he may have fell in the hallway area. Patient was brought to the ER CT head showed small subarachnoid hemorrhage and nasal bone fracture on exam patient had left periorbital hematoma. On-call neurosurgeon Dr. Barnet Pall was consulted and at this time Dr. Barnet Pall stated there is no indication for any surgical requirement and has advised a repeat CT head in 24 hours. Patient presently is asymptomatic and denies any chest pain or shortness of breath. He states he drinks alcohol every day but last 2 days he has not had any because the liquor shops were closed on Sunday. Patient has had previous history of seizure probably related to alcohol withdrawal. EKG at this time shows normal sinus rhythm. Patient has been admitted for further observation.  Review of Systems: As presented in the history of presenting illness, rest negative.  Past Medical History  Diagnosis Date  . Alcohol abuse     fell in 2006 and had "brain bleed"  . Seizures    Past Surgical History  Procedure Laterality Date  . Hernia repair    . Right shoulder surgery.     Social History:  reports that he has been smoking Cigars.  He does not have any smokeless tobacco history on file. He reports that  drinks alcohol. He reports that he does not use illicit drugs. Patient is visiting from [place]. where does patient live-- Can do ADLs. Can patient participate in ADLs?  No Known Allergies  Family History  Problem Relation Age of Onset  .  Stroke Father       Prior to Admission medications   Not on File   Physical Exam: Filed Vitals:   12/01/12 0030 12/01/12 0130 12/01/12 0200 12/01/12 0300  BP: 147/92 151/88 151/99 150/98  Pulse: 85 83 84 96  Temp:    98.3 F (36.8 C)  TempSrc:    Axillary  Resp: 15 19 16 12   Height:    6' (1.829 m)  Weight:    78.1 kg (172 lb 2.9 oz)  SpO2: 96% 96% 95% 96%     General:  Well-developed and nourished.  Eyes: Left periorbital hematoma.  ENT: Nasal swelling with no active discharge.  Neck: No mass felt. No neck rigidity.  Cardiovascular: S1-S2 heard.  Respiratory: No rhonchi or crepitations.  Abdomen: Soft nontender bowel sounds present.  Skin: No rash.  Musculoskeletal: No edema.  Psychiatric: Appears normal. Denies any suicidal thoughts.  Neurologic: Alert awake oriented to time place and person. Moves all extremities 5 x 5.  Labs on Admission:  Basic Metabolic Panel:  Recent Labs Lab 11/30/12 1640  NA 138  K 3.6  CL 96  CO2 29  GLUCOSE 94  BUN 5*  CREATININE 0.82  CALCIUM 9.6   Liver Function Tests:  Recent Labs Lab 11/30/12 1640 11/30/12 1920  AST 60* 60*  ALT 28 28  ALKPHOS 43 43  BILITOT 0.7 0.7  PROT 7.6 7.6  ALBUMIN 4.4 4.5   No results found for this basename: LIPASE,  AMYLASE,  in the last 168 hours No results found for this basename: AMMONIA,  in the last 168 hours CBC:  Recent Labs Lab 11/30/12 1640  WBC 5.4  NEUTROABS 4.2  HGB 13.8  HCT 38.8*  MCV 99.5  PLT 68*   Cardiac Enzymes: No results found for this basename: CKTOTAL, CKMB, CKMBINDEX, TROPONINI,  in the last 168 hours  BNP (last 3 results) No results found for this basename: PROBNP,  in the last 8760 hours CBG: No results found for this basename: GLUCAP,  in the last 168 hours  Radiological Exams on Admission: Dg Facial Bones Complete  11/30/2012  *RADIOLOGY REPORT*  Clinical Data: 37 year old male with loss of consciousness.  Fall. Pain.  FACIAL BONES  COMPLETE 3+V  Comparison: None.  Findings: Bone mineralization is within normal limits.  Paranasal sinuses appear normally pneumatized.  No abnormal intraorbital gas identified.  Mandible appears intact.  IMPRESSION: No plain radiographic evidence of acute facial fracture. If suspicion persists CT of the face would be more sensitive.   Original Report Authenticated By: Erskine Speed, M.D.    Dg Cervical Spine Complete  11/30/2012  *RADIOLOGY REPORT*  Clinical Data: 37 year old male with loss of consciousness.  Fall. Pain.  CERVICAL SPINE - COMPLETE 4+ VIEW  Comparison: None.  Findings: Reversed cervical lordosis.  Prevertebral soft tissue contour within normal limits. Cervicothoracic junction alignment is within normal limits.  Mild endplate spurring from C4 to the C6 level. Bilateral posterior element alignment is within normal limits.  AP alignment and lung apices within normal limits.  C1-C2 alignment and odontoid within normal limits.  IMPRESSION: No acute fracture or listhesis identified in the cervical spine. Ligamentous injury is not excluded.   Original Report Authenticated By: Erskine Speed, M.D.    Dg Shoulder Right  11/30/2012  *RADIOLOGY REPORT*  Clinical Data: Fall, shoulder pain  RIGHT SHOULDER - 2+ VIEW  Comparison: None  Findings: No acute fracture or malalignment.  The humeral head is located with respect to the glenoid on the scapular Y view.  The acromioclavicular joint remains congruent.  No acute rib fracture identified.  Visualized lung is clear.  IMPRESSION: Negative right shoulder   Original Report Authenticated By: Malachy Moan, M.D.    Ct Head Wo Contrast  11/30/2012  *RADIOLOGY REPORT*  Clinical Data: Loss of consciousness.  CT HEAD WITHOUT CONTRAST  Technique:  Contiguous axial images were obtained from the base of the skull through the vertex without contrast.  Comparison: None  Findings: There is a small focal blood collection, most likely in the subarachnoid space around the  left aspect of the brainstem near the tectum.  I do not see any other areas of subarachnoid hemorrhage or subdural hematoma.  The ventricles are normal in size and configuration.  The gray-white differentiation is maintained.  No acute skull fracture is identified.  There is a fracture of the bony nasal bridge.  There is soft tissue swelling around the left orbit.  The globes are intact.  The paranasal sinuses and mastoid air cells are clear except for a small amount of fluid in the left maxillary sinus and in the right half of the sphenoid sinus.  IMPRESSION:  1.  Small focus of subarachnoid hemorrhage around the left side of the brainstem and tectum.  Recommend observation  and follow-up CT scan. 2.  No acute skull fracture. 3.  Nasal bone fractures.   Original Report Authenticated By: Rudie Meyer, M.D.     EKG: Independently reviewed.  Normal sinus rhythm.  Assessment/Plan Principal Problem:   Subarachnoid hematoma Active Problems:   Syncope   Nasal fracture   Periorbital contusion of left eye   Alcoholism   1. Traumatic subarachnoid hemorrhage around the left side of the brainstem and tectum - as per Dr. Dutch Quint on-call neurosurgeon there is no indication for any active surgical intervention. Repeat CT head in 24 hours. Neurochecks. 2. Syncope most likely secondary to withdrawal seizures - at this time I have placed patient on alcohol withdrawal protocol. Continue to monitor in telemetry. Check EEG. 3. Nasal bone fracture and periorbital hematoma on the left side - may need outpatient referral to maxillofacial surgeon. 4. Alcoholism and tobacco abuse - strongly advised to quit smoking and drinking alcohol. 5. Thrombocytopenia probably related to alcoholism - closely follow CBC. 6. Elevated blood pressure - closely for blood pressure trend. At this time had placed patient on when necessary IV hydralazine for systolic blood pressure more than 160.    Code Status: Full code.  Family  Communication: None.  Disposition Plan: Admit to inpatient.    Trimaine Maser N. [facility] Pager [phone].  If 7PM-7AM, please contact night-coverage [email or web address] Password [email or web address] 12/01/2012, 3:18 AM

## 2012-12-02 ENCOUNTER — Other Ambulatory Visit (HOSPITAL_COMMUNITY): Payer: Self-pay

## 2012-12-02 LAB — CBC
HCT: 36.7 % — ABNORMAL LOW (ref 39.0–52.0)
Hemoglobin: 13.5 g/dL (ref 13.0–17.0)
MCH: 35.4 pg — ABNORMAL HIGH (ref 26.0–34.0)
MCV: 96.3 fL (ref 78.0–100.0)
RBC: 3.81 MIL/uL — ABNORMAL LOW (ref 4.22–5.81)
WBC: 6.3 10*3/uL (ref 4.0–10.5)

## 2012-12-02 LAB — COMPREHENSIVE METABOLIC PANEL
AST: 41 U/L — ABNORMAL HIGH (ref 0–37)
Albumin: 4.4 g/dL (ref 3.5–5.2)
BUN: 5 mg/dL — ABNORMAL LOW (ref 6–23)
Creatinine, Ser: 0.87 mg/dL (ref 0.50–1.35)
Total Protein: 7.7 g/dL (ref 6.0–8.3)

## 2012-12-02 NOTE — Progress Notes (Signed)
Subjective: Patient typically drinks liquor on a daily basis(pint + 2 beers). He did not have a drink on Sunday and only had one on Monday when he had a seizure.   He states that he doesn't want an MRI or to stay another night. I advise him that he would be at risk of serious medical complications if he were to leave.   Exam: Filed Vitals:   12/01/12 2352  BP: 146/92  Pulse:   Temp: 98.5 F (36.9 C)  Resp:    Gen: In bed, NAD MS: Awake, Alert, oriented to hospital, month, year. Able to give # of quarteres in $2.75 CN:R pupil 3mm and reactive, EOMI intact, left eye swollen shut. Swelling of left face, but VII appears to be intact. VFF in left eye.  Motor: Moves all extremities well and equally.  Sensory:endorses symmetric sensation.  Gait: steady casual gait.   Impression: 37 yo M with head injury during seizure likely secondary to ETOH withdrawal. At this time, I feel that his [facility] is likely traumatic. I do not feel that aneurysmal bleeding is likely given the location and negative CTA. I would favor an MRI to both further investigate hemorrhage as well as rule out other possible etiologies for seizure.   Recommendations: 1) MRI brain 2) Continue CIWA protocol.  3) If patient desires to leave, it would be against medical advice.  4) No AED therapy recommended at this time other than PRN ativan per CIWA.   Ritta Slot, MD [facility] [phone]  If 7pm- 7am, please page neurology on call at [phone].

## 2012-12-02 NOTE — Progress Notes (Signed)
Pt signed paper t o leave against medical advise. Michael Hobbs

## 2012-12-02 NOTE — Care Management Note (Signed)
    Page 1 of 1   12/02/2012     10:35:18 AM   CARE MANAGEMENT NOTE 12/02/2012  Patient:  Sweda,Aleksei R   Account Number:  [id number]  Date Initiated:  12/01/2012  Documentation initiated by:  Jiles Crocker  Subjective/Objective Assessment:   ADMITTED WITH Subarachnoid hematoma     Action/Plan:   PATIENT IS VISITING [place] FROM [place]; INDEPENDENT PRIOR TO ADMISSION; CM WILL CONTINUE TO FOLLOW FOR DCP   Anticipated DC Date:  12/02/2012   Anticipated DC Plan:  AGAINST MEDICAL ADVICE  In-house referral  Financial Counselor      DC Planning Services  CM consult      Choice offered to / List presented to:             Status of service:  Completed, signed off Medicare Important Message given?  NA - LOS <3 / Initial given by admissions (If response is "NO", the following Medicare IM given date fields will be blank) Date Medicare IM given:   Date Additional Medicare IM given:    Discharge Disposition:  AGAINST MEDICAL ADVICE  Per UR Regulation:  Reviewed for med. necessity/level of care/duration of stay  If discussed at Long Length of Stay Meetings, dates discussed:    Comments:  12/01/2012- B CHANDLER RN,BSN,MHA

## 2012-12-03 ENCOUNTER — Emergency Department (HOSPITAL_COMMUNITY): Payer: Medicaid - Out of State

## 2012-12-03 ENCOUNTER — Emergency Department (HOSPITAL_COMMUNITY)
Admission: EM | Admit: 2012-12-03 | Discharge: 2012-12-03 | Disposition: A | Discharge status: Home / Self Care | Payer: Medicaid - Out of State | Attending: Emergency Medicine | Admitting: Emergency Medicine

## 2012-12-03 ENCOUNTER — Encounter (HOSPITAL_COMMUNITY): Payer: Self-pay | Admitting: *Deleted

## 2012-12-03 DIAGNOSIS — F17200 Nicotine dependence, unspecified, uncomplicated: Secondary | ICD-10-CM | POA: Insufficient documentation

## 2012-12-03 DIAGNOSIS — I609 Nontraumatic subarachnoid hemorrhage, unspecified: Secondary | ICD-10-CM | POA: Insufficient documentation

## 2012-12-03 DIAGNOSIS — S066X9D Traumatic subarachnoid hemorrhage with loss of consciousness of unspecified duration, subsequent encounter: Secondary | ICD-10-CM

## 2012-12-03 DIAGNOSIS — Z8669 Personal history of other diseases of the nervous system and sense organs: Secondary | ICD-10-CM | POA: Insufficient documentation

## 2012-12-03 DIAGNOSIS — Z87828 Personal history of other (healed) physical injury and trauma: Secondary | ICD-10-CM | POA: Insufficient documentation

## 2012-12-03 DIAGNOSIS — D696 Thrombocytopenia, unspecified: Secondary | ICD-10-CM | POA: Insufficient documentation

## 2012-12-03 LAB — COMPREHENSIVE METABOLIC PANEL
AST: 43 U/L — ABNORMAL HIGH (ref 0–37)
Albumin: 4.2 g/dL (ref 3.5–5.2)
Chloride: 98 mEq/L (ref 96–112)
Creatinine, Ser: 0.73 mg/dL (ref 0.50–1.35)
Potassium: 3.5 mEq/L (ref 3.5–5.1)
Total Bilirubin: 0.4 mg/dL (ref 0.3–1.2)
Total Protein: 7.9 g/dL (ref 6.0–8.3)

## 2012-12-03 LAB — CBC WITH DIFFERENTIAL/PLATELET
Basophils Absolute: 0 10*3/uL (ref 0.0–0.1)
Lymphocytes Relative: 19 % (ref 12–46)
Lymphs Abs: 0.9 10*3/uL (ref 0.7–4.0)
Monocytes Relative: 13 % — ABNORMAL HIGH (ref 3–12)
Platelets: DECREASED 10*3/uL (ref 150–400)
RDW: 12.1 % (ref 11.5–15.5)
WBC: 4.8 10*3/uL (ref 4.0–10.5)

## 2012-12-03 MED ORDER — LORAZEPAM 2 MG/ML IJ SOLN: 1.0000 mg | Freq: Four times a day (QID) | INTRAMUSCULAR | Status: DC | PRN

## 2012-12-03 MED ORDER — LORAZEPAM 1 MG PO TABS: 1.0000 mg | ORAL_TABLET | Freq: Four times a day (QID) | ORAL | Status: DC | PRN

## 2012-12-03 MED ORDER — HYDROMORPHONE HCL PF 1 MG/ML IJ SOLN
1.0000 mg | Freq: Once | INTRAMUSCULAR | Status: AC
  Administered 2012-12-03: 1 mg via INTRAVENOUS
  Filled 2012-12-03: qty 1

## 2012-12-03 MED ORDER — LORAZEPAM 1 MG PO TABS: 0.0000 mg | ORAL_TABLET | Freq: Two times a day (BID) | ORAL | Status: DC

## 2012-12-03 MED ORDER — NICOTINE 14 MG/24HR TD PT24
14.0000 mg | MEDICATED_PATCH | Freq: Every day | TRANSDERMAL | Status: DC
  Filled 2012-12-03: qty 1

## 2012-12-03 MED ORDER — ADULT MULTIVITAMIN W/MINERALS CH: 1.0000 | ORAL_TABLET | Freq: Every day | ORAL | Status: DC

## 2012-12-03 MED ORDER — LORAZEPAM 1 MG PO TABS: 0.0000 mg | ORAL_TABLET | Freq: Four times a day (QID) | ORAL | Status: DC

## 2012-12-03 MED ORDER — ONDANSETRON HCL 4 MG/2ML IJ SOLN
4.0000 mg | Freq: Once | INTRAMUSCULAR | Status: AC
  Administered 2012-12-03: 4 mg via INTRAVENOUS
  Filled 2012-12-03: qty 2

## 2012-12-03 MED ORDER — THIAMINE HCL 100 MG/ML IJ SOLN: 100.0000 mg | Freq: Every day | INTRAMUSCULAR | Status: DC

## 2012-12-03 MED ORDER — VITAMIN B-1 100 MG PO TABS: 100.0000 mg | ORAL_TABLET | Freq: Every day | ORAL | Status: DC

## 2012-12-03 MED ORDER — FOLIC ACID 1 MG PO TABS: 1.0000 mg | ORAL_TABLET | Freq: Every day | ORAL | Status: DC

## 2012-12-03 NOTE — ED Notes (Signed)
To ED for further eval of HA. Pt was in hospital a cple days ago after syncope at home and signed out AMA due to being on Ativan and not feeling well from that. States he now has a HA. No Vomiting.

## 2012-12-03 NOTE — ED Provider Notes (Signed)
I saw and evaluated the patient, reviewed the resident's note and I agree with the findings and plan.  Fall 3 days ago with SAH suspected to be traumatic.  Left AMA yesterday.  Returns with headache.  No new trauma.  Neuro intact.  Neurology recommending MRI. Recent CTA negative for aneurysm.  Glynn Octave, MD 12/03/12 [phone]

## 2012-12-03 NOTE — ED Provider Notes (Signed)
Mr and ct imaging reviewed by dr Amada Jupiter, neuro No acute issue currently.  At this point, nothing to be done about thrombocytopenia Otherwise stable for d/c home.  D/w patient and he is agreeable with this plan    Joya Gaskins, MD 12/03/12 1751

## 2012-12-03 NOTE — ED Provider Notes (Signed)
Plan is to f/u on MR imaging then speak to neurology Pt stable at this time BP 142/105  Pulse 84  Temp(Src) 98.1 F (36.7 C) (Oral)  Resp 20  SpO2 95%  Joya Gaskins, MD 12/03/12 1631

## 2012-12-03 NOTE — ED Provider Notes (Signed)
History     CSN: [id number]  Arrival date & time 12/03/12  1359   First MD Initiated Contact with Patient 12/03/12 1420      Chief Complaint  Patient presents with  . Headache    (Consider location/radiation/quality/duration/timing/severity/associated sxs/prior treatment) HPI  Michael Hobbs is a 37 y.o. male with history of alcohol abuse with withdrawal seizures recently diagnosed with subarachnoid hemorrhage admitted to inpatient from which he AMA yesterday because he states that he was confused. Patient presents back to the ED today to finish up his testing. States his pain is severe, 8/10, last alcohol intake was one beer, one hour prior to arrival. Patient denies chest pain, shortness of breath, abdominal pain, nausea vomiting, dysarthria, ataxia, facial droop, unilateral weakness, visual or sensory disturbance, meningismus, or falls since he left the hospital.   Past Medical History  Diagnosis Date  . Alcohol abuse     fell in 2006 and had "brain bleed"  . Seizures     Past Surgical History  Procedure Laterality Date  . Hernia repair    . Right shoulder surgery.      Family History  Problem Relation Age of Onset  . Stroke Father     History  Substance Use Topics  . Smoking status: Current Every Day Smoker -- 1.00 packs/day    Types: Cigars  . Smokeless tobacco: Not on file  . Alcohol Use: Yes     Comment: 1 pint of beer, and a 1/4 pint of liquor       Review of Systems  Constitutional: Negative for fever.  Respiratory: Negative for shortness of breath.   Cardiovascular: Negative for chest pain.  Gastrointestinal: Negative for nausea, vomiting, abdominal pain and diarrhea.  Neurological: Positive for headaches. Negative for syncope, facial asymmetry, speech difficulty, weakness, light-headedness and numbness.  All other systems reviewed and are negative.    Allergies  Review of patient's allergies indicates no known allergies.  Home Medications  No  current outpatient prescriptions on file.  BP 134/114  Pulse 95  Temp(Src) 98.1 F (36.7 C) (Oral)  Resp 20  SpO2 95%  Physical Exam  Nursing note and vitals reviewed. Constitutional: He is oriented to person, place, and time. He appears well-developed and well-nourished. No distress.  HENT:  Head: Normocephalic.    Mouth/Throat: Oropharynx is clear and moist.  Eyes: Conjunctivae and EOM are normal. Pupils are equal, round, and reactive to light.  Neck: Normal range of motion. Neck supple.  No midline tenderness to palpation or step-offs appreciated. Patient has full range of motion without pain.  Cardiovascular: Normal rate, regular rhythm and intact distal pulses.   Pulmonary/Chest: Effort normal and breath sounds normal. No stridor. No respiratory distress. He has no wheezes. He has no rales. He exhibits no tenderness.  Abdominal: Soft. Bowel sounds are normal. He exhibits no distension and no mass. There is no tenderness. There is no rebound and no guarding.  Musculoskeletal: Normal range of motion.  Neurological: He is alert and oriented to person, place, and time.  Cranial nerves III through XII intact, strength 5 out of 5x4 extremities, negative pronator drift, finger to nose and heel-to-shin coordinated, sensation intact to pinprick and light touch, gait is coordinated and Romberg is negative.   Psychiatric: He has a normal mood and affect.    ED Course  Procedures (including critical care time)  Labs Reviewed - No data to display Ct Angio Head W/cm &/or Wo Cm  12/01/2012  *RADIOLOGY REPORT*  Clinical Data:  37 year old male with syncope and fall.  Small volume subarachnoid hemorrhage. Agitated.  Seizure.  CT ANGIOGRAPHY HEAD AND NECK  Technique:  Multidetector CT imaging of the head and neck was performed using the standard protocol during bolus administration of intravenous contrast.  Multiplanar CT image reconstructions including MIPs were obtained to evaluate the  vascular anatomy. Carotid stenosis measurements (when applicable) are obtained utilizing NASCET criteria, using the distal internal carotid diameter as the denominator.  Contrast: 50mL OMNIPAQUE IOHEXOL 350 MG/ML SOLN  Comparison:  Head CT without contrast 11/30/2012.  CTA NECK  Findings:  Minimal dependent atelectasis.  No lymphadenopathy in the superior mediastinum.  Negative thyroid, larynx, pharynx, parapharyngeal spaces, retropharyngeal space, sublingual space, submandibular glands and parotid glands.  No cervical lymphadenopathy.  No acute osseous abnormality in the neck.  Face and orbit findings described below.  Vascular Findings: Three-vessel arch configuration with no arch or great vessel atherosclerosis.  Normal right CCA origin.  Normal right carotid bifurcation. Moderate to severely tortuous distal cervical right ICA.  Normal right vertebral artery origin.  Mildly dominant right vertebral artery.  Normal cervical right vertebral artery.  Normal left CCA origin.  Normal left carotid bifurcation.  Left ICA tortuosity similar to that on the right.  Mildly nondominant left vertebral artery has a normal origin. Intermittent tortuosity of the left vertebral artery which is otherwise within normal limits in the neck.   Review of the MIP images confirms the above findings.  IMPRESSION: 1.  Negative neck CTA aside from vessel tortuosity, especially at both distal cervical ICA. 2.  See intracranial findings below. 3.  No acute soft tissue findings identified in the neck.  CTA HEAD  Findings:  Trace bilateral subdural collections suspected, mildly increased since yesterday and hypodense.  Residual small volume subarachnoid hemorrhage in the left ambient cistern.  This has mildly decreased.  No additional subarachnoid hemorrhage.  No intraventricular hemorrhage.  Basilar cisterns remain patent.  No ventriculomegaly.  No midline shift. Gray-white matter differentiation is within normal limits throughout the brain.   No abnormal enhancement identified.  Visualized paranasal sinuses and mastoids are clear.  Left periorbital and scalp soft tissue hematoma.  Left greater than right nasal bone fractures.  Globes and other intraorbital soft tissues remain within normal limits.  No calvarium fracture identified.  Vascular Findings: Major intracranial venous structures are patent.  Mildly dominant distal right vertebral artery.  Normal right PICA. Normal vertebrobasilar junction.  Dominant left AICA.  No basilar stenosis.  SCA and PCA origins are within normal limits.  Small right posterior communicating artery.  The left is more diminutive or absent.  Bilateral PCA branches are within normal limits.  Both ICA siphons are patent without atherosclerosis or stenosis. On the right, there is a small 2-3 mm infundibulum of the right posterior communicating artery origin suspected (sagittal image 59).  Otherwise normal distal ICA.  Normal carotid termini.  MCA and ACA origins are within normal limits.  Normal anterior communicating artery.  ACA branches are within normal limits. Bilateral MCA branches are within normal limits.   Review of the MIP images confirms the above findings.  IMPRESSION: 1.  Small volume of subarachnoid hemorrhage in the left ambient cistern, mildly decreased from prior. 2.  Small post traumatic bilateral subdural hygromas or hypodense hematomas are suspected.  No significant mass effect. 3.  Negative intracranial CTA; a right posterior communicating artery origin infundibulum is present, with no convincing intracranial aneurysm or lesion identified to explain the site  of the subarachnoid hemorrhage. 4.  Left periorbital and scalp soft tissue injury.   Original Report Authenticated By: Erskine Speed, M.D.    Ct Head Wo Contrast  12/03/2012  *RADIOLOGY REPORT*  Clinical Data:  Headache.  CT HEAD WITHOUT CONTRAST CT MAXILLOFACIAL WITHOUT CONTRAST  Technique:  Multidetector CT imaging of the head and maxillofacial  structures were performed using the standard protocol without intravenous contrast. Multiplanar CT image reconstructions of the maxillofacial structures were also generated.  Comparison:  Head CT scan 11/30/2012.  CT angiogram head of 9th 12/01/2012.  CT HEAD  Findings: Tiny amount of hyperattenuating material along the left aspect of the ambient cisterna has decreased in volume.  No new hemorrhage is identified.  There may be small bilateral subdural hygromas, unchanged.  No midline shift, hydrocephalus, acute infarction, mass lesion or mass effect is identified.  No pneumocephalus or hydrocephalus.  The calvarium is intact.  IMPRESSION: Interval decrease in small amount of subarachnoid hemorrhage about the ambient cistern.  No new abnormality.  CT MAXILLOFACIAL  Findings:   There is soft tissue swelling about the face and eyes. The globes are intact and the lenses are located.  There may be a tiny chip fracture off the right nasal bone.  No other finding to suggest fracture is identified.  Visualized paranasal sinuses demonstrate minimal mucosal thickening in the left maxillary. Limited visualization of the cervical spine demonstrates multilevel spondylosis.  IMPRESSION: Soft tissue swelling about the base with a possible tiny chip fracture off the right nasal bone.  The study is otherwise negative.   Original Report Authenticated By: Holley Dexter, M.D.    Ct Angio Neck W/cm &/or Wo/cm  12/01/2012  *RADIOLOGY REPORT*  Clinical Data:  37 year old male with syncope and fall.  Small volume subarachnoid hemorrhage. Agitated.  Seizure.  CT ANGIOGRAPHY HEAD AND NECK  Technique:  Multidetector CT imaging of the head and neck was performed using the standard protocol during bolus administration of intravenous contrast.  Multiplanar CT image reconstructions including MIPs were obtained to evaluate the vascular anatomy. Carotid stenosis measurements (when applicable) are obtained utilizing NASCET criteria, using the  distal internal carotid diameter as the denominator.  Contrast: 50mL OMNIPAQUE IOHEXOL 350 MG/ML SOLN  Comparison:  Head CT without contrast 11/30/2012.  CTA NECK  Findings:  Minimal dependent atelectasis.  No lymphadenopathy in the superior mediastinum.  Negative thyroid, larynx, pharynx, parapharyngeal spaces, retropharyngeal space, sublingual space, submandibular glands and parotid glands.  No cervical lymphadenopathy.  No acute osseous abnormality in the neck.  Face and orbit findings described below.  Vascular Findings: Three-vessel arch configuration with no arch or great vessel atherosclerosis.  Normal right CCA origin.  Normal right carotid bifurcation. Moderate to severely tortuous distal cervical right ICA.  Normal right vertebral artery origin.  Mildly dominant right vertebral artery.  Normal cervical right vertebral artery.  Normal left CCA origin.  Normal left carotid bifurcation.  Left ICA tortuosity similar to that on the right.  Mildly nondominant left vertebral artery has a normal origin. Intermittent tortuosity of the left vertebral artery which is otherwise within normal limits in the neck.   Review of the MIP images confirms the above findings.  IMPRESSION: 1.  Negative neck CTA aside from vessel tortuosity, especially at both distal cervical ICA. 2.  See intracranial findings below. 3.  No acute soft tissue findings identified in the neck.  CTA HEAD  Findings:  Trace bilateral subdural collections suspected, mildly increased since yesterday and hypodense.  Residual small  volume subarachnoid hemorrhage in the left ambient cistern.  This has mildly decreased.  No additional subarachnoid hemorrhage.  No intraventricular hemorrhage.  Basilar cisterns remain patent.  No ventriculomegaly.  No midline shift. Gray-white matter differentiation is within normal limits throughout the brain.  No abnormal enhancement identified.  Visualized paranasal sinuses and mastoids are clear.  Left periorbital and  scalp soft tissue hematoma.  Left greater than right nasal bone fractures.  Globes and other intraorbital soft tissues remain within normal limits.  No calvarium fracture identified.  Vascular Findings: Major intracranial venous structures are patent.  Mildly dominant distal right vertebral artery.  Normal right PICA. Normal vertebrobasilar junction.  Dominant left AICA.  No basilar stenosis.  SCA and PCA origins are within normal limits.  Small right posterior communicating artery.  The left is more diminutive or absent.  Bilateral PCA branches are within normal limits.  Both ICA siphons are patent without atherosclerosis or stenosis. On the right, there is a small 2-3 mm infundibulum of the right posterior communicating artery origin suspected (sagittal image 59).  Otherwise normal distal ICA.  Normal carotid termini.  MCA and ACA origins are within normal limits.  Normal anterior communicating artery.  ACA branches are within normal limits. Bilateral MCA branches are within normal limits.   Review of the MIP images confirms the above findings.  IMPRESSION: 1.  Small volume of subarachnoid hemorrhage in the left ambient cistern, mildly decreased from prior. 2.  Small post traumatic bilateral subdural hygromas or hypodense hematomas are suspected.  No significant mass effect. 3.  Negative intracranial CTA; a right posterior communicating artery origin infundibulum is present, with no convincing intracranial aneurysm or lesion identified to explain the site of the subarachnoid hemorrhage. 4.  Left periorbital and scalp soft tissue injury.   Original Report Authenticated By: Erskine Speed, M.D.    Ct Maxillofacial Wo Cm  12/03/2012  *RADIOLOGY REPORT*  Clinical Data:  Headache.  CT HEAD WITHOUT CONTRAST CT MAXILLOFACIAL WITHOUT CONTRAST  Technique:  Multidetector CT imaging of the head and maxillofacial structures were performed using the standard protocol without intravenous contrast. Multiplanar CT image  reconstructions of the maxillofacial structures were also generated.  Comparison:  Head CT scan 11/30/2012.  CT angiogram head of 9th 12/01/2012.  CT HEAD  Findings: Tiny amount of hyperattenuating material along the left aspect of the ambient cisterna has decreased in volume.  No new hemorrhage is identified.  There may be small bilateral subdural hygromas, unchanged.  No midline shift, hydrocephalus, acute infarction, mass lesion or mass effect is identified.  No pneumocephalus or hydrocephalus.  The calvarium is intact.  IMPRESSION: Interval decrease in small amount of subarachnoid hemorrhage about the ambient cistern.  No new abnormality.  CT MAXILLOFACIAL  Findings:   There is soft tissue swelling about the face and eyes. The globes are intact and the lenses are located.  There may be a tiny chip fracture off the right nasal bone.  No other finding to suggest fracture is identified.  Visualized paranasal sinuses demonstrate minimal mucosal thickening in the left maxillary. Limited visualization of the cervical spine demonstrates multilevel spondylosis.  IMPRESSION: Soft tissue swelling about the base with a possible tiny chip fracture off the right nasal bone.  The study is otherwise negative.   Original Report Authenticated By: Holley Dexter, M.D.      1. Subarachnoid hematoma, subsequent encounter   2. Thrombocytopenia       MDM   Faith Rogue is a 37  y.o. male with alcohol abuse and traumatic subarachnoid hematoma diagnosed on the 15th. Patient signed out AMA yesterday. He comes back to the emergency room stating that he was confused and would like further workup.   Consult from hospitalist Dr. Benjamine Mola appreciated: She states she would like to get the MRI and ED. I have also spoken with neurologist Dr. Amada Jupiter who states that MRI as needed prior to disposition. From a neurologic standpoint, he states that if the MRI shows no concerning abnormalities he is fine to discharge the patient  home because it was explained to discharge him home at 48 hours after admission from his prior visit.  Case signed out to attending Dr. Shonna Chock line at shift change: Plan is to followup results of the MRI and speak with neurologist to advise on appropriate disposition.   Filed Vitals:   12/03/12 1403 12/03/12 1513  BP: 134/114 142/105  Pulse: 95 84  Temp: 98.1 F (36.7 C)   TempSrc: Oral   Resp: 20   SpO2: 95%       Wynetta Emery, PA-C 12/03/12 1635

## 2012-12-21 NOTE — Discharge Summary (Signed)
Date of admission: 11/30/12 Date of discharge: 12/01/12  Attending at the time of discharge: Maretta Bees (though patietn left AMA prior to being seen)  I was a Research scientist (medical) on the case, but did see the patient a single time on the day of discharge.   Principal diagnosis - Subarachnoid hemorrhage.  Secondary diagnosis - Seizure (likey due to EtOH withdrawal)  Procedures:  CT angio head 04/15 - 1. Small volume of subarachnoid hemorrhage in the left ambient  cistern, mildly decreased from prior.  2. Small post traumatic bilateral subdural hygromas or hypodense  hematomas are suspected. No significant mass effect.  3. Negative intracranial CTA; a right posterior communicating  artery origin infundibulum is present, with no convincing  intracranial aneurysm or lesion identified to explain the site of  the subarachnoid hemorrhage.  4. Left periorbital and scalp soft tissue injury.  CT angio neck 04/15 - 1. Negative neck CTA aside from vessel tortuosity, especially at  both distal cervical ICA.  2. See intracranial findings below.  3. No acute soft tissue findings identified in the neck.   Hospital Course: Patient was admitted with facial injury following a seizure presumed to be from alcohol withdrawal. On head CT, he was found to have a small perimesencephalic hemorrhage. He was admitted and had a CT angiogram performed which did not demonstrate any aneurysmal source of hemorrhage. He was to be observed further and have MR imaging to look for other potential causes of his blood, however the patient refused further treatment despite being able to clearly state the reasons for his hospitalization and potential risks related to leaving against medical advice. The patient left AMA.   Ritta Slot, MD [facility] [phone]  If 7pm- 7am, please page neurology on call at [phone].

## 2018-01-13 ENCOUNTER — Emergency Department (HOSPITAL_COMMUNITY)
Admission: EM | Admit: 2018-01-13 | Discharge: 2018-01-14 | Disposition: A | Discharge status: Home / Self Care | Payer: Self-pay | Attending: Emergency Medicine | Admitting: Emergency Medicine

## 2018-01-13 ENCOUNTER — Other Ambulatory Visit: Payer: Self-pay

## 2018-01-13 ENCOUNTER — Emergency Department (HOSPITAL_COMMUNITY): Payer: Self-pay

## 2018-01-13 ENCOUNTER — Encounter (HOSPITAL_COMMUNITY): Payer: Self-pay

## 2018-01-13 DIAGNOSIS — W010XXA Fall on same level from slipping, tripping and stumbling without subsequent striking against object, initial encounter: Secondary | ICD-10-CM | POA: Insufficient documentation

## 2018-01-13 DIAGNOSIS — F17290 Nicotine dependence, other tobacco product, uncomplicated: Secondary | ICD-10-CM | POA: Insufficient documentation

## 2018-01-13 DIAGNOSIS — R55 Syncope and collapse: Secondary | ICD-10-CM | POA: Insufficient documentation

## 2018-01-13 DIAGNOSIS — Y92512 Supermarket, store or market as the place of occurrence of the external cause: Secondary | ICD-10-CM | POA: Insufficient documentation

## 2018-01-13 DIAGNOSIS — Y990 Civilian activity done for income or pay: Secondary | ICD-10-CM | POA: Insufficient documentation

## 2018-01-13 DIAGNOSIS — Y939 Activity, unspecified: Secondary | ICD-10-CM | POA: Insufficient documentation

## 2018-01-13 DIAGNOSIS — S8392XA Sprain of unspecified site of left knee, initial encounter: Secondary | ICD-10-CM | POA: Insufficient documentation

## 2018-01-13 LAB — BASIC METABOLIC PANEL
ANION GAP: 12 (ref 5–15)
BUN: 8 mg/dL (ref 6–20)
CO2: 27 mmol/L (ref 22–32)
Calcium: 9.4 mg/dL (ref 8.9–10.3)
Chloride: 102 mmol/L (ref 101–111)
Creatinine, Ser: 0.93 mg/dL (ref 0.61–1.24)
GFR calc non Af Amer: >60 mL/min (ref >60)
GLUCOSE: 88 mg/dL (ref 65–99)
Potassium: 3.9 mmol/L (ref 3.5–5.1)
Sodium: 141 mmol/L (ref 135–145)

## 2018-01-13 LAB — URINALYSIS, ROUTINE W REFLEX MICROSCOPIC
BILIRUBIN URINE: NEGATIVE
Glucose, UA: NEGATIVE mg/dL
Hgb urine dipstick: NEGATIVE
KETONES UR: 5 mg/dL — AB
Leukocytes, UA: NEGATIVE
Nitrite: NEGATIVE
Protein, ur: NEGATIVE mg/dL
SPECIFIC GRAVITY, URINE: 1.028 (ref 1.005–1.030)
pH: 6 (ref 5.0–8.0)

## 2018-01-13 LAB — CBC
HCT: 41.6 % (ref 39.0–52.0)
HEMOGLOBIN: 14.1 g/dL (ref 13.0–17.0)
MCH: 34 pg (ref 26.0–34.0)
MCHC: 33.9 g/dL (ref 30.0–36.0)
MCV: 100.2 fL — ABNORMAL HIGH (ref 78.0–100.0)
Platelets: 187 10*3/uL (ref 150–400)
RBC: 4.15 MIL/uL — ABNORMAL LOW (ref 4.22–5.81)
RDW: 12.4 % (ref 11.5–15.5)
WBC: 6.1 10*3/uL (ref 4.0–10.5)

## 2018-01-13 NOTE — ED Triage Notes (Signed)
Pt presents with brief syncopal episode this morning.  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  Pt reports he awoke as soon as he hit the floor.  Pt reports he did not eat breakfast and reports 2 episodes of vomiting when he returned to work.  EMS was called but pt declined transport because his knees weren't as painful then.

## 2018-01-14 MED ORDER — IBUPROFEN 400 MG PO TABS
400.0000 mg | ORAL_TABLET | Freq: Once | ORAL | Status: AC | PRN
  Administered 2018-01-14: 400 mg via ORAL
  Filled 2018-01-14: qty 1

## 2018-01-14 MED ORDER — IBUPROFEN 400 MG PO TABS
400.0000 mg | ORAL_TABLET | Freq: Once | ORAL | Status: AC
  Administered 2018-01-14: 400 mg via ORAL
  Filled 2018-01-14: qty 1

## 2018-01-14 NOTE — ED Provider Notes (Signed)
Emergency Department Provider Note   I have reviewed the triage vital signs and the nursing notes.   HISTORY  Chief Complaint Fall   HPI Michael Hobbs is a 42 y.o. male who had decreased eating, drinking and sleeping yesterday and went to work. Every time he bent over to pick up a box and stood up he got light headed. After this happened a few times he had a near syncopal episode and fell to his knees and now knees hurt. hes not too worried about the syncope necessarily as he has had episodes like this in the past. He is more worried about the knee pain that has arisen and worsened since that time.  He does not remember twisting it but just feels like it has some type of deep bruise.  No chest pain, headache, palpitations, shortness of breath prior to the fall or near syncopal episode.  No history of heart problems or family history of early death.  No other associated or modifying symptoms.    Past Medical History:  Diagnosis Date  . Alcohol abuse    fell in 2006 and had "brain bleed"  . Seizures [facility])     Patient Active Problem List   Diagnosis Date Noted  . Syncope 12/01/2012  . Subarachnoid hematoma ([place]) 12/01/2012  . Nasal fracture 12/01/2012  . Periorbital contusion of left eye 12/01/2012  . Alcoholism ([place]) 12/01/2012    Past Surgical History:  Procedure Laterality Date  . HERNIA REPAIR    . right shoulder surgery.        Allergies Patient has no known allergies.  Family History  Problem Relation Age of Onset  . Stroke Father     Social History Social History   Tobacco Use  . Smoking status: Current Every Day Smoker    Packs/day: 1.00    Types: Cigars  . Smokeless tobacco: Never Used  Substance Use Topics  . Alcohol use: Yes    Comment: 1 pint of beer, and a 1/4 pint of liquor   . Drug use: No    Review of Systems  All other systems negative except as documented in the HPI. All pertinent positives and negatives as reviewed in the  HPI. ____________________________________________   PHYSICAL EXAM:  VITAL SIGNS: ED Triage Vitals  Enc Vitals Group     BP 01/13/18 1844 108/71     Pulse Rate 01/13/18 1844 (!) 57     Resp 01/13/18 1844 16     Temp 01/13/18 1844 98.5 F (36.9 C)     Temp Source 01/13/18 1844 Oral     SpO2 01/13/18 1844 97 %     Weight 01/13/18 1847 190 lb (86.2 kg)     Height 01/13/18 1847 6' (1.829 m)     Head Circumference --      Peak Flow --      Pain Score 01/13/18 1846 10     Pain Loc --      Pain Edu? --      Excl. in [place]? --     Constitutional: Alert and oriented. Well appearing and in no acute distress. Eyes: Conjunctivae are normal. PERRL. EOMI. Head: Atraumatic. Nose: No congestion/rhinnorhea. Mouth/Throat: Mucous membranes are moist.  Oropharynx non-erythematous. Neck: No stridor.  No meningeal signs.   Cardiovascular: Normal rate, regular rhythm. Good peripheral circulation. Grossly normal heart sounds.   Respiratory: Normal respiratory effort.  No retractions. Lungs CTAB. Gastrointestinal: Soft and nontender. No distention.  Musculoskeletal: No lower extremity tenderness  nor edema. No gross deformities of extremities. Mild l knee effusion. Neurologic:  Normal speech and language. No gross focal neurologic deficits are appreciated.  Skin:  Skin is warm, dry and intact. No rash noted.  ____________________________________________   LABS (all labs ordered are listed, but only abnormal results are displayed)  Labs Reviewed  CBC - Abnormal; Notable for the following components:      Result Value   RBC 4.15 (*)    MCV 100.2 (*)    All other components within normal limits  URINALYSIS, ROUTINE W REFLEX MICROSCOPIC - Abnormal; Notable for the following components:   Ketones, ur 5 (*)    All other components within normal limits  BASIC METABOLIC PANEL  CBG MONITORING, ED   ____________________________________________  EKG   EKG Interpretation  Date/Time:  Tuesday Jan 13 2018 18:55:21 EDT Ventricular Rate:  56 PR Interval:  150 QRS Duration: 74 QT Interval:  438 QTC Calculation: 422 R Axis:   51 Text Interpretation:  Sinus bradycardia Otherwise normal ECG No significant change since last tracing Confirmed by Merrily Pew [phone]) on 01/14/2018 7:48:24 AM       ____________________________________________  RADIOLOGY  Dg Knee 2 Views Left  Result Date: 01/13/2018 CLINICAL DATA:  42 year old male status post fall in convenience store today on concrete floor. Bilateral knee pain. EXAM: LEFT KNEE - 1-2 VIEW COMPARISON:  None. FINDINGS: Small suprapatellar joint effusion is evident on the 2nd lateral image. Joint spaces and alignment are maintained. The patella appears intact. No fracture or dislocation identified. IMPRESSION: Small joint effusion with no acute fracture or dislocation identified about the left knee. Electronically Signed   By: Genevie Ann M.D.   On: 01/13/2018 19:56   Dg Knee 2 Views Right  Result Date: 01/13/2018 CLINICAL DATA:  Fall EXAM: RIGHT KNEE - 1-2 VIEW COMPARISON:  None. FINDINGS: No fracture or dislocation is seen. The joint spaces are preserved. The visualized soft tissues are unremarkable. No suprapatellar knee joint effusion IMPRESSION: Negative. Electronically Signed   By: Julian Hy M.D.   On: 01/13/2018 19:56    ____________________________________________   PROCEDURES  Procedure(s) performed:   Procedures   ____________________________________________   INITIAL IMPRESSION / ASSESSMENT AND PLAN / ED COURSE  Suspect syncope is likely secondary to dehydration as it seemed to be with rapid movements from his squatting to standing.  No red flags or high risk features to suggest further work-up beyond his normal EKG and electrolytes. Knee pain is likely traumatic secondary to an effusion.  Plan will be to compression elevation ice and rest with anti-inflammatories.     Pertinent labs & imaging results that were  available during my care of the patient were reviewed by me and considered in my medical decision making (see chart for details).  ____________________________________________  FINAL CLINICAL IMPRESSION(S) / ED DIAGNOSES  Final diagnoses:  Sprain of left knee, unspecified ligament, initial encounter  Near syncope     MEDICATIONS GIVEN DURING THIS VISIT:  Medications  ibuprofen (ADVIL,MOTRIN) tablet 400 mg (400 mg Oral Given 01/14/18 0100)  ibuprofen (ADVIL,MOTRIN) tablet 400 mg (400 mg Oral Given 01/14/18 0816)     NEW OUTPATIENT MEDICATIONS STARTED DURING THIS VISIT:  New Prescriptions   No medications on file    Note:  This note was prepared with assistance of Dragon voice recognition software. Occasional wrong-word or sound-a-like substitutions may have occurred due to the inherent limitations of voice recognition software.   Dijon Kohlman, Corene Cornea, MD 01/14/18 0830

## 2018-01-14 NOTE — ED Notes (Signed)
Ace wrap placed. Cap refill wnl post placement. Discharge instructions reviewed and pt verbalizes understanding

## 2018-01-14 NOTE — Discharge Instructions (Addendum)
Apply a compressive ACE bandage. Rest and elevate the affected painful area.  Apply cold compresses intermittently as needed.  As pain recedes, begin normal activities slowly as tolerated.  Call if symptoms persist.

## 2018-03-11 ENCOUNTER — Other Ambulatory Visit: Payer: Self-pay

## 2018-03-11 ENCOUNTER — Emergency Department
Admission: EM | Admit: 2018-03-11 | Discharge: 2018-03-11 | Disposition: A | Discharge status: Home / Self Care | Payer: Self-pay | Attending: Emergency Medicine | Admitting: Emergency Medicine

## 2018-03-11 ENCOUNTER — Emergency Department: Payer: Self-pay

## 2018-03-11 DIAGNOSIS — Y998 Other external cause status: Secondary | ICD-10-CM | POA: Insufficient documentation

## 2018-03-11 DIAGNOSIS — Y939 Activity, unspecified: Secondary | ICD-10-CM | POA: Insufficient documentation

## 2018-03-11 DIAGNOSIS — Y33XXXA Other specified events, undetermined intent, initial encounter: Secondary | ICD-10-CM | POA: Insufficient documentation

## 2018-03-11 DIAGNOSIS — F1010 Alcohol abuse, uncomplicated: Secondary | ICD-10-CM | POA: Insufficient documentation

## 2018-03-11 DIAGNOSIS — Y9289 Other specified places as the place of occurrence of the external cause: Secondary | ICD-10-CM | POA: Insufficient documentation

## 2018-03-11 DIAGNOSIS — F17290 Nicotine dependence, other tobacco product, uncomplicated: Secondary | ICD-10-CM | POA: Insufficient documentation

## 2018-03-11 DIAGNOSIS — R569 Unspecified convulsions: Secondary | ICD-10-CM | POA: Insufficient documentation

## 2018-03-11 DIAGNOSIS — S161XXA Strain of muscle, fascia and tendon at neck level, initial encounter: Secondary | ICD-10-CM | POA: Insufficient documentation

## 2018-03-11 LAB — URINE DRUG SCREEN, QUALITATIVE (ARMC ONLY)
AMPHETAMINES, UR SCREEN: NOT DETECTED
BENZODIAZEPINE, UR SCRN: NOT DETECTED
Barbiturates, Ur Screen: NOT DETECTED
Cannabinoid 50 Ng, Ur ~~LOC~~: NOT DETECTED
Cocaine Metabolite,Ur ~~LOC~~: NOT DETECTED
MDMA (Ecstasy)Ur Screen: NOT DETECTED
METHADONE SCREEN, URINE: NOT DETECTED
Opiate, Ur Screen: NOT DETECTED
Phencyclidine (PCP) Ur S: NOT DETECTED
Tricyclic, Ur Screen: NOT DETECTED

## 2018-03-11 LAB — CBC
HEMATOCRIT: 43.2 % (ref 40.0–52.0)
HEMOGLOBIN: 15.2 g/dL (ref 13.0–18.0)
MCH: 36.1 pg — ABNORMAL HIGH (ref 26.0–34.0)
MCHC: 35.1 g/dL (ref 32.0–36.0)
MCV: 102.7 fL — ABNORMAL HIGH (ref 80.0–100.0)
Platelets: 87 10*3/uL — ABNORMAL LOW (ref 150–440)
RBC: 4.21 MIL/uL — ABNORMAL LOW (ref 4.40–5.90)
RDW: 13.1 % (ref 11.5–14.5)
WBC: 5.5 10*3/uL (ref 3.8–10.6)

## 2018-03-11 LAB — COMPREHENSIVE METABOLIC PANEL
ALBUMIN: 5.4 g/dL — AB (ref 3.5–5.0)
ALK PHOS: 43 U/L (ref 38–126)
ALT: 31 U/L (ref 0–44)
ANION GAP: 14 (ref 5–15)
AST: 71 U/L — AB (ref 15–41)
BUN: 10 mg/dL (ref 6–20)
CALCIUM: 10.2 mg/dL (ref 8.9–10.3)
CO2: 29 mmol/L (ref 22–32)
Chloride: 98 mmol/L (ref 98–111)
Creatinine, Ser: 1 mg/dL (ref 0.61–1.24)
GFR calc Af Amer: >60 mL/min (ref >60)
GFR calc non Af Amer: >60 mL/min (ref >60)
Glucose, Bld: 111 mg/dL — ABNORMAL HIGH (ref 70–99)
POTASSIUM: 3.6 mmol/L (ref 3.5–5.1)
Sodium: 141 mmol/L (ref 135–145)
TOTAL PROTEIN: 8.8 g/dL — AB (ref 6.5–8.1)
Total Bilirubin: 1 mg/dL (ref 0.3–1.2)

## 2018-03-11 LAB — ETHANOL

## 2018-03-11 MED ORDER — TETANUS-DIPHTH-ACELL PERTUSSIS 5-2.5-18.5 LF-MCG/0.5 IM SUSP: 0.5000 mL | Freq: Once | INTRAMUSCULAR | Status: DC

## 2018-03-11 MED ORDER — SODIUM CHLORIDE 0.9 % IV BOLUS
1000.0000 mL | Freq: Once | INTRAVENOUS | Status: AC
  Administered 2018-03-11: 1000 mL via INTRAVENOUS

## 2018-03-11 MED ORDER — ACETAMINOPHEN 500 MG PO TABS
ORAL_TABLET | ORAL | Status: AC
  Filled 2018-03-11: qty 2

## 2018-03-11 NOTE — ED Provider Notes (Signed)
[facility] Emergency Department Provider Note  ____________________________________________  Time seen: Approximately 6:41 PM  I have reviewed the triage vital signs and the nursing notes.   HISTORY  Chief Complaint Seizures   HPI Michael Hobbs is a 42 y.o. male with a history of a traumatic subarachnoid hemorrhage and alcohol abuse who presents for evaluation of seizure.  Patient reports this is his fifth seizures since having a subarachnoid hemorrhage in 2006.  He was seen by a neurologist with a negative EEG and was never placed on any medication.  Patient reports that he drinks a pint of liquor every day.  Last drink was yesterday evening.  Has had several seizures in the setting of not drinking for 1 day.  Patient reports that he was at work, he works at [employer], when he started feeling dizzy.  Per coworkers patient had a generalized tonic-clonic seizure and collapsed to the ground.  Patient had a short postictal phase and only remembers being on a stretcher here at the hospital.  Patient is complaining of sharp neck pain which has been constant since the seizure episode, midline, on the lower c-spine.  Patient denies back pain, facial pain, chest pain, extremity pain.  Patient denies urinary or bowel incontinence, tongue trauma.  Patient denies family history of seizure.  Patient denies drug use.   Past Medical History:  Diagnosis Date  . Alcohol abuse    fell in 2006 and had "brain bleed"  . Seizures [facility])     Patient Active Problem List   Diagnosis Date Noted  . Syncope 12/01/2012  . Subarachnoid hematoma ([place]) 12/01/2012  . Nasal fracture 12/01/2012  . Periorbital contusion of left eye 12/01/2012  . Alcoholism ([place]) 12/01/2012    Past Surgical History:  Procedure Laterality Date  . HERNIA REPAIR    . right shoulder surgery.      Prior to Admission medications   Not on File    Allergies Patient has no known allergies.  Family  History  Problem Relation Age of Onset  . Stroke Father     Social History Social History   Tobacco Use  . Smoking status: Current Every Day Smoker    Packs/day: 1.00    Types: Cigars  . Smokeless tobacco: Never Used  Substance Use Topics  . Alcohol use: Yes    Comment: 1 pint of beer, and a 1/4 pint of liquor   . Drug use: No    Review of Systems Constitutional: Negative for fever. + dizziness Eyes: Negative for visual changes. ENT: + facial injury and neck pain Cardiovascular: Negative for chest injury. Respiratory: Negative for shortness of breath. Negative for chest wall injury. Gastrointestinal: Negative for abdominal pain or injury. Genitourinary: Negative for dysuria. Musculoskeletal: Negative for back injury, negative for arm or leg pain. Skin: Negative for laceration/abrasions. Neurological: + head injury, seizure   ____________________________________________   PHYSICAL EXAM:  VITAL SIGNS: ED Triage Vitals  Enc Vitals Group     BP 03/11/18 1814 (!) 128/113     Pulse Rate 03/11/18 1814 84     Resp 03/11/18 1814 19     Temp 03/11/18 1814 97.8 F (36.6 C)     Temp Source 03/11/18 1814 Oral     SpO2 03/11/18 1814 100 %     Weight 03/11/18 1815 185 lb (83.9 kg)     Height 03/11/18 1815 6' (1.829 m)     Head Circumference --      Peak Flow --  Pain Score 03/11/18 1814 8     Pain Loc --      Pain Edu? --      Excl. in [place]? --     Constitutional: Alert and oriented. No acute distress. Does not appear intoxicated. HEENT Head: Normocephalic and atraumatic. Face: No facial bony tenderness. Stable midface Ears: No hemotympanum bilaterally. No Battle sign Eyes: No eye injury. PERRL. No raccoon eyes. R lower periorbital abrasion. R eyebrow abrasion Nose: Nontender. No epistaxis. No rhinorrhea Mouth/Throat: Mucous membranes are moist. No oropharyngeal blood. No dental injury. Airway patent without stridor. Normal voice. Neck: C-collar in place. Lower  midline c-spine tenderness. No stepoffs Cardiovascular: Normal rate, regular rhythm. Normal and symmetric distal pulses are present in all extremities. Pulmonary/Chest: Chest wall is stable and nontender to palpation/compression. Normal respiratory effort. Breath sounds are normal. No crepitus.  Abdominal: Soft, nontender, non distended. Musculoskeletal: Nontender with normal full range of motion in all extremities. No deformities. No thoracic or lumbar midline spinal tenderness. Pelvis is stable. Skin: Skin is warm, dry and intact. No abrasions or contutions. Psychiatric: Speech and behavior are appropriate. Neurological: Normal speech and language. Moves all extremities to command. No gross focal neurologic deficits are appreciated.  Glascow Coma Score: 4 - Opens eyes on own 6 - Follows simple motor commands 5 - Alert and oriented GCS: 15  ____________________________________________   LABS (all labs ordered are listed, but only abnormal results are displayed)  Labs Reviewed  CBC - Abnormal; Notable for the following components:      Result Value   RBC 4.21 (*)    MCV 102.7 (*)    MCH 36.1 (*)    Platelets 87 (*)    All other components within normal limits  COMPREHENSIVE METABOLIC PANEL - Abnormal; Notable for the following components:   Glucose, Bld 111 (*)    Total Protein 8.8 (*)    Albumin 5.4 (*)    AST 71 (*)    All other components within normal limits  ETHANOL  URINE DRUG SCREEN, QUALITATIVE ([facility] ONLY)  CBG MONITORING, ED   ____________________________________________  EKG  ED ECG REPORT I, Rudene Re, the attending physician, personally viewed and interpreted this ECG.   Normal sinus rhythm, rate of 86, normal intervals, LVH, normal axis, no ST elevations or depressions.  Unchanged from prior ____________________________________________  RADIOLOGY  I have personally reviewed the images performed during this visit and I agree with the Radiologist's  read.   Interpretation by Radiologist:  Ct Head Wo Contrast  Result Date: 03/11/2018 CLINICAL DATA:  Injury sustained from fall related to seizure. EXAM: CT HEAD WITHOUT CONTRAST CT MAXILLOFACIAL WITHOUT CONTRAST CT CERVICAL SPINE WITHOUT CONTRAST TECHNIQUE: Multidetector CT imaging of the head, cervical spine, and maxillofacial structures were performed using the standard protocol without intravenous contrast. Multiplanar CT image reconstructions of the cervical spine and maxillofacial structures were also generated. COMPARISON:  Head CT dated 12/03/2012 and brain MRI dated 12/03/2012. FINDINGS: CT HEAD FINDINGS Brain: Ventricles are within normal limits. All areas of the brain demonstrate appropriate gray-white matter differentiation. There is no mass, hemorrhage, edema or other evidence of acute parenchymal abnormality. No extra-axial hemorrhage. Vascular: No hyperdense vessel or unexpected calcification. Skull: Normal. Negative for fracture or focal lesion. Other: Mild scalp edema overlying the LEFT frontoparietal bone. No underlying fracture. CT MAXILLOFACIAL FINDINGS Osseous: Lower frontal bones are intact. Slight deformity of the anterior nasal bones, of uncertain age, suspect chronic. Osseous structures about the orbits are intact and normally aligned bilaterally.  Walls of the maxillary sinuses are intact and normally aligned bilaterally. Bilateral zygomatic arches and pterygoid plates are intact. No mandible fracture or displacement seen. Orbits: Negative. No traumatic or inflammatory finding. Sinuses: Mild mucosal thickening within the LEFT maxillary sinus. Otherwise clear. Soft tissues: Negative. CT CERVICAL SPINE FINDINGS Alignment: There is kyphosis of the cervical spine, reversal of the normal cervical spine lordosis, presumably chronic with a similar appearance to earlier CT from 2014. Skull base and vertebrae: No fracture line or displaced fracture fragment seen. Facet joints appear intact and  normally aligned throughout. Soft tissues and spinal canal: No prevertebral fluid or swelling. No visible canal hematoma. Disc levels: Degenerative spondylosis throughout the mid and lower cervical spine, mild to moderate in degree with mild disc space narrowings and osseous spurring. Associated mild to moderate central canal stenoses at the C3-4 and C4-5 levels, likely accentuated by the cervical kyphosis. Upper chest: No acute findings. Emphysematous blebs at the lung apices. Other: Bilateral carotid atherosclerosis. IMPRESSION: 1. Mild scalp edema overlying the LEFT frontoparietal bone. No underlying fracture. 2. No acute intracranial abnormality. No intracranial mass, hemorrhage or edema. 3. Slight deformity of the anterior nasal bones, of uncertain age but favored to be chronic. No other facial bone fracture or displacement seen. 4. No fracture or acute subluxation within the cervical spine. Chronic/degenerative changes of the cervical spine, as detailed above. 5. Emphysematous changes within the lung apices. 6. Carotid atherosclerosis. Electronically Signed   By: Franki Cabot M.D.   On: 03/11/2018 19:18   Ct Cervical Spine Wo Contrast  Result Date: 03/11/2018 CLINICAL DATA:  Injury sustained from fall related to seizure. EXAM: CT HEAD WITHOUT CONTRAST CT MAXILLOFACIAL WITHOUT CONTRAST CT CERVICAL SPINE WITHOUT CONTRAST TECHNIQUE: Multidetector CT imaging of the head, cervical spine, and maxillofacial structures were performed using the standard protocol without intravenous contrast. Multiplanar CT image reconstructions of the cervical spine and maxillofacial structures were also generated. COMPARISON:  Head CT dated 12/03/2012 and brain MRI dated 12/03/2012. FINDINGS: CT HEAD FINDINGS Brain: Ventricles are within normal limits. All areas of the brain demonstrate appropriate gray-white matter differentiation. There is no mass, hemorrhage, edema or other evidence of acute parenchymal abnormality. No  extra-axial hemorrhage. Vascular: No hyperdense vessel or unexpected calcification. Skull: Normal. Negative for fracture or focal lesion. Other: Mild scalp edema overlying the LEFT frontoparietal bone. No underlying fracture. CT MAXILLOFACIAL FINDINGS Osseous: Lower frontal bones are intact. Slight deformity of the anterior nasal bones, of uncertain age, suspect chronic. Osseous structures about the orbits are intact and normally aligned bilaterally. Walls of the maxillary sinuses are intact and normally aligned bilaterally. Bilateral zygomatic arches and pterygoid plates are intact. No mandible fracture or displacement seen. Orbits: Negative. No traumatic or inflammatory finding. Sinuses: Mild mucosal thickening within the LEFT maxillary sinus. Otherwise clear. Soft tissues: Negative. CT CERVICAL SPINE FINDINGS Alignment: There is kyphosis of the cervical spine, reversal of the normal cervical spine lordosis, presumably chronic with a similar appearance to earlier CT from 2014. Skull base and vertebrae: No fracture line or displaced fracture fragment seen. Facet joints appear intact and normally aligned throughout. Soft tissues and spinal canal: No prevertebral fluid or swelling. No visible canal hematoma. Disc levels: Degenerative spondylosis throughout the mid and lower cervical spine, mild to moderate in degree with mild disc space narrowings and osseous spurring. Associated mild to moderate central canal stenoses at the C3-4 and C4-5 levels, likely accentuated by the cervical kyphosis. Upper chest: No acute findings. Emphysematous blebs at the lung  apices. Other: Bilateral carotid atherosclerosis. IMPRESSION: 1. Mild scalp edema overlying the LEFT frontoparietal bone. No underlying fracture. 2. No acute intracranial abnormality. No intracranial mass, hemorrhage or edema. 3. Slight deformity of the anterior nasal bones, of uncertain age but favored to be chronic. No other facial bone fracture or displacement  seen. 4. No fracture or acute subluxation within the cervical spine. Chronic/degenerative changes of the cervical spine, as detailed above. 5. Emphysematous changes within the lung apices. 6. Carotid atherosclerosis. Electronically Signed   By: Franki Cabot M.D.   On: 03/11/2018 19:18   Ct Maxillofacial Wo Contrast  Result Date: 03/11/2018 CLINICAL DATA:  Injury sustained from fall related to seizure. EXAM: CT HEAD WITHOUT CONTRAST CT MAXILLOFACIAL WITHOUT CONTRAST CT CERVICAL SPINE WITHOUT CONTRAST TECHNIQUE: Multidetector CT imaging of the head, cervical spine, and maxillofacial structures were performed using the standard protocol without intravenous contrast. Multiplanar CT image reconstructions of the cervical spine and maxillofacial structures were also generated. COMPARISON:  Head CT dated 12/03/2012 and brain MRI dated 12/03/2012. FINDINGS: CT HEAD FINDINGS Brain: Ventricles are within normal limits. All areas of the brain demonstrate appropriate gray-white matter differentiation. There is no mass, hemorrhage, edema or other evidence of acute parenchymal abnormality. No extra-axial hemorrhage. Vascular: No hyperdense vessel or unexpected calcification. Skull: Normal. Negative for fracture or focal lesion. Other: Mild scalp edema overlying the LEFT frontoparietal bone. No underlying fracture. CT MAXILLOFACIAL FINDINGS Osseous: Lower frontal bones are intact. Slight deformity of the anterior nasal bones, of uncertain age, suspect chronic. Osseous structures about the orbits are intact and normally aligned bilaterally. Walls of the maxillary sinuses are intact and normally aligned bilaterally. Bilateral zygomatic arches and pterygoid plates are intact. No mandible fracture or displacement seen. Orbits: Negative. No traumatic or inflammatory finding. Sinuses: Mild mucosal thickening within the LEFT maxillary sinus. Otherwise clear. Soft tissues: Negative. CT CERVICAL SPINE FINDINGS Alignment: There is  kyphosis of the cervical spine, reversal of the normal cervical spine lordosis, presumably chronic with a similar appearance to earlier CT from 2014. Skull base and vertebrae: No fracture line or displaced fracture fragment seen. Facet joints appear intact and normally aligned throughout. Soft tissues and spinal canal: No prevertebral fluid or swelling. No visible canal hematoma. Disc levels: Degenerative spondylosis throughout the mid and lower cervical spine, mild to moderate in degree with mild disc space narrowings and osseous spurring. Associated mild to moderate central canal stenoses at the C3-4 and C4-5 levels, likely accentuated by the cervical kyphosis. Upper chest: No acute findings. Emphysematous blebs at the lung apices. Other: Bilateral carotid atherosclerosis. IMPRESSION: 1. Mild scalp edema overlying the LEFT frontoparietal bone. No underlying fracture. 2. No acute intracranial abnormality. No intracranial mass, hemorrhage or edema. 3. Slight deformity of the anterior nasal bones, of uncertain age but favored to be chronic. No other facial bone fracture or displacement seen. 4. No fracture or acute subluxation within the cervical spine. Chronic/degenerative changes of the cervical spine, as detailed above. 5. Emphysematous changes within the lung apices. 6. Carotid atherosclerosis. Electronically Signed   By: Franki Cabot M.D.   On: 03/11/2018 19:18      ____________________________________________   PROCEDURES  Procedure(s) performed: None Procedures Critical Care performed:  None ____________________________________________   INITIAL IMPRESSION / ASSESSMENT AND PLAN / ED COURSE   42 y.o. male with a history of a traumatic subarachnoid hemorrhage and alcohol abuse who presents for evaluation of seizure.  Patient has had no urinary or bowel loss, however based on description of coworkers  and the fact the patient had a postictal phase this is most likely seizure other than syncope.   Will check an EKG to evaluate for any evidence of dysrhythmias and monitor patient on telemetry.  Will check labs to rule out hypoglycemia or electrolyte abnormalities as possible causes of patient's seizures.  Seems like seizures have started after patient had a subarachnoid hemorrhage in the setting of alcohol abuse.  Most likely from alcohol abuse although at this time patient has CIWA score of 0. Will monitor closely. CT head/face/cspine pending. Tetanus booster given.    _________________________ 9:53 PM on 03/11/2018 -----------------------------------------  CT with no acute injuries.  Labs consistent with mild  liver dysfunction with mildly elevated AST and thrombocytopenia.  Discussed these findings with patient and explained the dangers and morbidity associated with a cirrhosis of the liver.  Patient was provided with resources for detox in the community.  Patient did not express desire for detoxing at this time.  Remaining of his labs did not show any acute abnormalities.  Patient was given seizure precautions including not driving or operating heavy machinery.  Discussed return precautions for any signs of complicated withdrawal or recurrent of the seizures.   As part of my medical decision making, I reviewed the following data within the [place] History obtained from family, Nursing notes reviewed and incorporated, Labs reviewed , EKG interpreted , Old EKG reviewed, Old chart reviewed, Radiograph reviewed , Notes from prior ED visits and [place] Controlled Substance Database    Pertinent labs & imaging results that were available during my care of the patient were reviewed by me and considered in my medical decision making (see chart for details).    ____________________________________________   FINAL CLINICAL IMPRESSION(S) / ED DIAGNOSES  Final diagnoses:  Seizure ([place])  Alcohol abuse  Strain of neck muscle, initial encounter      NEW MEDICATIONS STARTED  DURING THIS VISIT:  ED Discharge Orders    None       Note:  This document was prepared using Dragon voice recognition software and may include unintentional dictation errors.    Alfred Levins, [place], MD 03/11/18 2155

## 2018-03-11 NOTE — BH Assessment (Signed)
Writer discussed with the patient about the option for detox/treatment with [facility].  Patient was in the agreement with the plan but stated that he doubted he would go tonight or tomorrow. Pt's family at bedside trying to convince pt that this is the right course of action but pt is irritable.  Patient signed the Release of information, in order to send labs and ER Note.  Information sent sent to [facility] via fax and this writer will call and check on receipt status. It is not likely that pt will follow through with referral however, this writer will send it anyway in hopes that he changes his mind in the coming days.

## 2018-03-11 NOTE — ED Notes (Signed)
Family at bedside. 

## 2018-03-11 NOTE — ED Notes (Signed)
Pt to the er for injuries sustained from a fall r/t a seizure. Pt was moving pallets at work when he states he became dizzy and fell having a seizure. Pt has a hd a total of 5 seizures in the past. No dx or meds. Pt has swelling to the right eye and right side nose. Pt reports pain to the neck. C Collar applied.

## 2018-03-11 NOTE — ED Notes (Signed)
Pt and family requesting info on detox. MD notified. [facility] consult placed.

## 2018-03-11 NOTE — ED Triage Notes (Signed)
Pt to the er for seizure and injury r/t. Pt has a brief hx of seizures with no cause and no meds. Pt remembers being dizzy just before.

## 2018-03-11 NOTE — Discharge Instructions (Signed)
Seizures may happen at any time. It is important to take certain precautions to maintain your safety.  ° °Follow up with your doctor in 1-3 days. ° °If you were started on a seizure medication, take it as prescribed. ° °During a seizure, a person may injure himself or herself. Seizure precautions are guidelines that a person can follow in order to minimize injury during a seizure. For any activity, it is important to ask, "What would happen if I had a seizure while doing this?" Follow the below precautions.  ° °Bathroom Safety  °A person with seizures may want to shower instead of bathe to avoid accidental drowning. If falls occur during the patient's typical seizure, a person should use a shower seat, preferably one with a safety strap.  °Use nonskid strips in your shower or tub.  °Never use electrical equipment near water. This prevents accidental electrocution.  °Consider changing glass in shower doors to shatterproof glass. ° °Kitchen Safety °If possible, cook when someone else is nearby.  °Use the back burners of the stove to prevent accidental burns.  °Use shatterproof containers as much as possible. For instance, sauces can be transferred from glass bottles to plastic containers for use.  °Limit time that is required using knives or other sharp objects. If possible, buy foods that are already cut, or ask someone to help in meal preparation.  ° °General Safety at Home °Do not smoke or light fires in the fireplace unless someone else is present.  °Do not use space heaters that can be accidentally overturned.  °When alone, avoid using step stools or ladders, and do not clean rooftop gutters.  °Purchase power tools and motorized lawn equipment which have a safety switch that will stop the machine if you release the handle (a 'dead man's' switch).  ° °Driving and Transportation °DO NOT DRIVE UNTIL YOU ARE CLEARED BY A NEUROLOGIST and/or you have permission to drive from your state's Department of Motor Vehicles   °(DMV). Each state has different laws. Please refer to the following link on the Epilepsy Foundation of America's website for more information: http://www.epilepsyfoundation.org/answerplace/Social/driving/drivingu.cfm  °If you ride a bicycle, wear a helmet and any other necessary protective gear.  °When taking public transportation like the bus or subway, stay clear of the platform edge.  ° °Outdoor and Sports Safety °Swimming is okay, but does present certain risks. Never swim alone, and tell friends what to do if you have a seizure while swimming.  °Wear appropriate protective equipment.  °Ski with a friend. If a seizure occurs, your friend can seek help, if needed. He or she can also help to get you out of the cold. Consider using a safety hook or belt while riding the ski lift.  ° ° °

## 2018-03-11 NOTE — ED Notes (Signed)
Tdap cancelled. Last Tdap within the last 5 years.

## 2018-03-11 NOTE — ED Notes (Signed)
TTS in with pt.

## 2018-05-18 ENCOUNTER — Encounter: Payer: Self-pay | Admitting: Neurology

## 2018-07-20 ENCOUNTER — Encounter: Payer: Self-pay | Admitting: Neurology

## 2018-07-20 ENCOUNTER — Other Ambulatory Visit: Payer: Self-pay

## 2018-07-20 ENCOUNTER — Encounter

## 2018-07-20 ENCOUNTER — Ambulatory Visit (INDEPENDENT_AMBULATORY_CARE_PROVIDER_SITE_OTHER): Payer: Self-pay | Admitting: Neurology

## 2018-07-20 VITALS — BP 168/88 | HR 113 | Ht 72.0 in | Wt 184.0 lb

## 2018-07-20 DIAGNOSIS — R569 Unspecified convulsions: Secondary | ICD-10-CM

## 2018-07-20 NOTE — Patient Instructions (Addendum)
1. Schedule MRI brain with and without contrast  We have sent a referral to [facility] for your MRI and they will call you directly to schedule your appt. They are located at [place]. If you need to contact them directly please call [phone].   2. Schedule 1-hour sleep-deprived EEG 3. Start cutting down and eventually stopping alcohol intake as this can induce seizures 4. Our office will call your with results and medication plans, follow-up after tests  Seizure Precautions: 1. If medication has been prescribed for you to prevent seizures, take it exactly as directed.  Do not stop taking the medicine without talking to your doctor first, even if you have not had a seizure in a long time.   2. Avoid activities in which a seizure would cause danger to yourself or to others.  Don't operate dangerous machinery, swim alone, or climb in high or dangerous places, such as on ladders, roofs, or girders.  Do not drive unless your doctor says you may.  3. If you have any warning that you may have a seizure, lay down in a safe place where you can't hurt yourself.    4.  No driving for 6 months from last seizure, as per [facility].   Please refer to the following link on the [place] website for more information: http://www.epilepsyfoundation.org/answerplace/Social/driving/drivingu.cfm   5.  Maintain good sleep hygiene. Avoid alcohol.  6.  Contact your doctor if you have any problems that may be related to the medicine you are taking.  7.  Call 911 and bring the patient back to the ED if:        A.  The seizure lasts longer than 5 minutes.       B.  The patient doesn't awaken shortly after the seizure  C.  The patient has new problems such as difficulty seeing, speaking or moving  D.  The patient was injured during the seizure  E.  The patient has a temperature over 102 F (39C)  F.  The patient vomited and now is having trouble  breathing
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
## 2018-07-27 ENCOUNTER — Ambulatory Visit (INDEPENDENT_AMBULATORY_CARE_PROVIDER_SITE_OTHER): Payer: Self-pay | Admitting: Neurology

## 2018-07-27 DIAGNOSIS — R569 Unspecified convulsions: Secondary | ICD-10-CM

## 2018-07-30 NOTE — Procedures (Signed)
ELECTROENCEPHALOGRAM REPORT  Date of Study: 07/27/2018  Patient's Name: Michael Hobbs MRN: [id number] Date of Birth: 04-Jul-1976  Referring Provider: Dr. Ellouise Newer  Clinical History: This is a 42 year old man with recurrent seizures, possibly alcohol-induced.  Medications: none  Technical Summary: A multichannel digital 1-hour sleep-deprived EEG recording measured by the international 10-20 system with electrodes applied with paste and impedances below 5000 ohms performed in our laboratory with EKG monitoring in an awake and asleep patient.  Hyperventilation and photic stimulation were performed.  The digital EEG was referentially recorded, reformatted, and digitally filtered in a variety of bipolar and referential montages for optimal display.    Description: The patient is awake and asleep during the recording.  During maximal wakefulness, there is a symmetric, medium voltage 9.5 Hz posterior dominant rhythm that attenuates with eye opening.  The record is symmetric.  During drowsiness and sleep, there is an increase in theta slowing of the background.  Vertex waves and symmetric sleep spindles were seen.  Hyperventilation and photic stimulation did not elicit any abnormalities.  There were no epileptiform discharges or electrographic seizures seen.    EKG lead was unremarkable.  Impression: This 1-hour awake and asleep EEG is normal.    Clinical Correlation: A normal EEG does not exclude a clinical diagnosis of epilepsy.  If further clinical questions remain, prolonged EEG may be helpful.  Clinical correlation is advised.   Ellouise Newer, M.D.

## 2018-10-02 ENCOUNTER — Encounter (HOSPITAL_COMMUNITY): Payer: Self-pay

## 2018-10-02 ENCOUNTER — Inpatient Hospital Stay (HOSPITAL_COMMUNITY)
Admission: EM | Admit: 2018-10-02 | Discharge: 2018-10-04 | DRG: 871 | Disposition: A | Discharge status: Home / Self Care | Payer: Self-pay | Attending: Family Medicine | Admitting: Family Medicine

## 2018-10-02 ENCOUNTER — Emergency Department (HOSPITAL_COMMUNITY): Payer: Self-pay

## 2018-10-02 ENCOUNTER — Other Ambulatory Visit: Payer: Self-pay

## 2018-10-02 DIAGNOSIS — A419 Sepsis, unspecified organism: Principal | ICD-10-CM | POA: Diagnosis present

## 2018-10-02 DIAGNOSIS — F17210 Nicotine dependence, cigarettes, uncomplicated: Secondary | ICD-10-CM | POA: Diagnosis present

## 2018-10-02 DIAGNOSIS — R569 Unspecified convulsions: Secondary | ICD-10-CM | POA: Diagnosis present

## 2018-10-02 DIAGNOSIS — F10239 Alcohol dependence with withdrawal, unspecified: Secondary | ICD-10-CM | POA: Diagnosis present

## 2018-10-02 DIAGNOSIS — F1011 Alcohol abuse, in remission: Secondary | ICD-10-CM | POA: Diagnosis present

## 2018-10-02 DIAGNOSIS — I10 Essential (primary) hypertension: Secondary | ICD-10-CM | POA: Diagnosis present

## 2018-10-02 DIAGNOSIS — J189 Pneumonia, unspecified organism: Secondary | ICD-10-CM | POA: Diagnosis present

## 2018-10-02 DIAGNOSIS — Z823 Family history of stroke: Secondary | ICD-10-CM

## 2018-10-02 DIAGNOSIS — J13 Pneumonia due to Streptococcus pneumoniae: Secondary | ICD-10-CM | POA: Diagnosis present

## 2018-10-02 DIAGNOSIS — D6959 Other secondary thrombocytopenia: Secondary | ICD-10-CM | POA: Diagnosis present

## 2018-10-02 DIAGNOSIS — F1020 Alcohol dependence, uncomplicated: Secondary | ICD-10-CM | POA: Diagnosis present

## 2018-10-02 HISTORY — DX: Pneumonia, unspecified organism: J18.9

## 2018-10-02 LAB — CBC WITH DIFFERENTIAL/PLATELET
Abs Immature Granulocytes: 1.07 10*3/uL — ABNORMAL HIGH (ref 0.00–0.07)
Basophils Absolute: 0.1 10*3/uL (ref 0.0–0.1)
Basophils Relative: 0 %
EOS ABS: 0 10*3/uL (ref 0.0–0.5)
EOS PCT: 0 %
HCT: 45.2 % (ref 39.0–52.0)
Hemoglobin: 15.2 g/dL (ref 13.0–17.0)
Immature Granulocytes: 5 %
Lymphocytes Relative: 4 %
Lymphs Abs: 1 10*3/uL (ref 0.7–4.0)
MCH: 34.6 pg — ABNORMAL HIGH (ref 26.0–34.0)
MCHC: 33.6 g/dL (ref 30.0–36.0)
MCV: 103 fL — ABNORMAL HIGH (ref 80.0–100.0)
Monocytes Absolute: 0.6 10*3/uL (ref 0.1–1.0)
Monocytes Relative: 3 %
Neutro Abs: 20.1 10*3/uL — ABNORMAL HIGH (ref 1.7–7.7)
Neutrophils Relative %: 88 %
Platelets: 95 10*3/uL — ABNORMAL LOW (ref 150–400)
RBC: 4.39 MIL/uL (ref 4.22–5.81)
RDW: 13.5 % (ref 11.5–15.5)
WBC: 22.8 10*3/uL — ABNORMAL HIGH (ref 4.0–10.5)
nRBC: 0 % (ref 0.0–0.2)

## 2018-10-02 LAB — COMPREHENSIVE METABOLIC PANEL
ALK PHOS: 55 U/L (ref 38–126)
ALT: 38 U/L (ref 0–44)
AST: 112 U/L — AB (ref 15–41)
Albumin: 4.4 g/dL (ref 3.5–5.0)
Anion gap: 18 — ABNORMAL HIGH (ref 5–15)
BUN: 7 mg/dL (ref 6–20)
CALCIUM: 8.6 mg/dL — AB (ref 8.9–10.3)
CO2: 20 mmol/L — AB (ref 22–32)
CREATININE: 1.24 mg/dL (ref 0.61–1.24)
Chloride: 96 mmol/L — ABNORMAL LOW (ref 98–111)
Glucose, Bld: 74 mg/dL (ref 70–99)
Potassium: 3.7 mmol/L (ref 3.5–5.1)
SODIUM: 134 mmol/L — AB (ref 135–145)
Total Bilirubin: 1.8 mg/dL — ABNORMAL HIGH (ref 0.3–1.2)
Total Protein: 7.7 g/dL (ref 6.5–8.1)

## 2018-10-02 LAB — ETHANOL: Alcohol, Ethyl (B): <10 mg/dL (ref <10)

## 2018-10-02 LAB — CK: Total CK: 84 U/L (ref 49–397)

## 2018-10-02 LAB — INFLUENZA PANEL BY PCR (TYPE A & B)
INFLAPCR: NEGATIVE
Influenza B By PCR: NEGATIVE

## 2018-10-02 LAB — LACTIC ACID, PLASMA: Lactic Acid, Venous: 4.2 mmol/L (ref 0.5–1.9)

## 2018-10-02 MED ORDER — VITAMIN B-1 100 MG PO TABS: 100.0000 mg | ORAL_TABLET | Freq: Every day | ORAL | Status: DC

## 2018-10-02 MED ORDER — SODIUM CHLORIDE 0.9 % IV BOLUS (SEPSIS)
1000.0000 mL | Freq: Once | INTRAVENOUS | Status: AC
  Administered 2018-10-02: 1000 mL via INTRAVENOUS

## 2018-10-02 MED ORDER — ACETAMINOPHEN 325 MG PO TABS
650.0000 mg | ORAL_TABLET | Freq: Once | ORAL | Status: AC | PRN
  Administered 2018-10-02: 650 mg via ORAL
  Filled 2018-10-02: qty 2

## 2018-10-02 MED ORDER — SODIUM CHLORIDE 0.9% FLUSH: 3.0000 mL | Freq: Once | INTRAVENOUS | Status: DC

## 2018-10-02 MED ORDER — LORAZEPAM 2 MG/ML IJ SOLN: 0.0000 mg | Freq: Two times a day (BID) | INTRAMUSCULAR | Status: DC

## 2018-10-02 MED ORDER — SODIUM CHLORIDE 0.9 % IV SOLN
500.0000 mg | INTRAVENOUS | Status: DC
  Administered 2018-10-02 – 2018-10-04 (×2): 500 mg via INTRAVENOUS
  Filled 2018-10-02 (×3): qty 500

## 2018-10-02 MED ORDER — THIAMINE HCL 100 MG/ML IJ SOLN: 100.0000 mg | Freq: Every day | INTRAMUSCULAR | Status: DC

## 2018-10-02 MED ORDER — LORAZEPAM 1 MG PO TABS: 0.0000 mg | ORAL_TABLET | Freq: Two times a day (BID) | ORAL | Status: DC

## 2018-10-02 MED ORDER — SODIUM CHLORIDE 0.9 % IV BOLUS (SEPSIS)
500.0000 mL | Freq: Once | INTRAVENOUS | Status: AC
  Administered 2018-10-02: 500 mL via INTRAVENOUS

## 2018-10-02 MED ORDER — SODIUM CHLORIDE 0.9 % IV SOLN
2.0000 g | INTRAVENOUS | Status: DC
  Administered 2018-10-02 – 2018-10-03 (×2): 2 g via INTRAVENOUS
  Filled 2018-10-02: qty 2
  Filled 2018-10-02: qty 20

## 2018-10-02 MED ORDER — LORAZEPAM 2 MG/ML IJ SOLN
0.0000 mg | Freq: Four times a day (QID) | INTRAMUSCULAR | Status: DC
  Administered 2018-10-02: 1 mg via INTRAVENOUS
  Filled 2018-10-02: qty 1

## 2018-10-02 MED ORDER — LORAZEPAM 1 MG PO TABS: 0.0000 mg | ORAL_TABLET | Freq: Four times a day (QID) | ORAL | Status: DC

## 2018-10-02 NOTE — ED Notes (Signed)
Date and time results received: 10/02/18 2255  Test: Lactic Acid Critical Value: 4.2  Name of Provider Notified:Bowie Rona Ravens EDPA  Orders Received? Or Actions Taken?: see chart

## 2018-10-02 NOTE — ED Provider Notes (Signed)
Medical screening examination/treatment/procedure(s) were conducted as a shared visit with non-physician practitioner(s) and myself.  I personally evaluated the patient during the encounter.  EKG Interpretation  Date/Time:  Friday October 02 2018 21:12:32 EST Ventricular Rate:  148 PR Interval:    QRS Duration: 97 QT Interval:  281 QTC Calculation: 441 R Axis:   163 Text Interpretation:  Sinus tachycardia Paired ventricular premature complexes Probable left atrial enlargement Probable lateral infarct, age indeterminate Since last tracing rate faster Confirmed by Dorie Rank [phone]) on 10/02/2018 9:14:42 PM Patient reports he has abdominal cough body aches and fever.  One episode of vomiting last night.  Patient reports he does drink alcohol regularly.  Patient has had alcohol withdrawal seizures.  He is alert and appropriate at this time.  Patient's parents are present.  They do report that the last time he was hospitalized after a seizure, he had 2 to 3 days of having to be treated for alcohol withdrawal.  Patient is alert and appropriate.  Clear mental status.  Mild tachypnea but no respiratory distress.  Heart tachycardic no gross rub murmur gallop.  Lungs grossly clear.  Patient does have wet cough with deep inspiration. Abdomen soft and nontender.  Lower extremities without peripheral edema. Skin warm and dry. Patient does not have tremor.  Nonfocal neurologic exam.  Cognitive function normal at this time.  Agree with management sepsis for pneumonia.  I have added CIWA protocol.  Patient likely high risk for alcohol withdrawal in conjunction with infectious illness.  Patient denies use of IV drugs.   Charlesetta Shanks, MD 10/02/18 2230

## 2018-10-02 NOTE — ED Notes (Signed)
Patient attempted to urinate but was not able to

## 2018-10-02 NOTE — ED Provider Notes (Addendum)
[facility] DEPT Provider Note   CSN: [id number] Arrival date & time: 10/02/18  2055     History   Chief Complaint Chief Complaint  Patient presents with  . Shortness of Breath  . Flank Pain  . Cough    HPI Michael Hobbs is a 43 y.o. male.  The history is provided by the patient. No language interpreter was used.  Shortness of Breath  Associated symptoms: cough   Flank Pain  Associated symptoms include shortness of breath.  Cough  Associated symptoms: shortness of breath      43 year old male with history of alcohol abuse, seizure presenting with flu symptoms.  Patient reports since early this morning he has had fever, chills, body aches, chest pain, pleuritic in nature, shortness of breath, nausea, generalized weakness and 1 episodes of vomit.  Pain is described as sharp, and burning sensation.  Pain is affecting the left side of his chest more than the right side.  Symptoms moderate in severity with no specific treatment tried.  He has not had his flu shot.  Denies any recent sick contact or recent travel.  No known drugs allergy.  Admits to tobacco use but stated he did not smoke today.  Has history of high blood pressure. Hx of alcohol abuse. No prior hx of PE/DVT, no recent surgery, prolonged bedrest, or recent travel.  Past Medical History:  Diagnosis Date  . Alcohol abuse    fell in 2006 and had "brain bleed"  . Seizures [facility])     Patient Active Problem List   Diagnosis Date Noted  . Syncope 12/01/2012  . Subarachnoid hematoma ([place]) 12/01/2012  . Nasal fracture 12/01/2012  . Periorbital contusion of left eye 12/01/2012  . Alcoholism ([place]) 12/01/2012    Past Surgical History:  Procedure Laterality Date  . HERNIA REPAIR    . right shoulder surgery.          Home Medications    Prior to Admission medications   Not on File    Family History Family History  Problem Relation Age of Onset  . Stroke Father     Social  History Social History   Tobacco Use  . Smoking status: Current Every Day Smoker    Packs/day: 1.00    Types: Cigars  . Smokeless tobacco: Never Used  Substance Use Topics  . Alcohol use: Yes    Comment: 1 pint of beer, and a 1/4 pint of liquor   . Drug use: No     Allergies   Patient has no known allergies.   Review of Systems Review of Systems  Respiratory: Positive for cough and shortness of breath.   Genitourinary: Positive for flank pain.  All other systems reviewed and are negative.    Physical Exam Updated Vital Signs BP (!) 133/102 (BP Location: Left Arm)   Pulse (!) 147   Temp (!) 102.6 F (39.2 C) (Oral)   Resp 20   SpO2 91%   Physical Exam Vitals signs and nursing note reviewed.  Constitutional:      General: He is not in acute distress.    Appearance: He is well-developed.  HENT:     Head: Atraumatic.     Mouth/Throat:     Mouth: Mucous membranes are moist.  Eyes:     Conjunctiva/sclera: Conjunctivae normal.  Neck:     Musculoskeletal: Normal range of motion and neck supple.  Cardiovascular:     Rate and Rhythm: Tachycardia present.  Pulmonary:  Effort: Pulmonary effort is normal.     Breath sounds: Decreased breath sounds and rhonchi present. No wheezing or rales.  Chest:     Chest wall: No tenderness.  Musculoskeletal:     Right lower leg: No edema.     Left lower leg: No edema.  Skin:    Capillary Refill: Capillary refill takes less than 2 seconds.     Findings: No rash.  Neurological:     Mental Status: He is alert and oriented to person, place, and time.      ED Treatments / Results  Labs (all labs ordered are listed, but only abnormal results are displayed) Labs Reviewed  LACTIC ACID, PLASMA - Abnormal; Notable for the following components:      Result Value   Lactic Acid, Venous 4.2 (*)    All other components within normal limits  COMPREHENSIVE METABOLIC PANEL - Abnormal; Notable for the following components:   Sodium  134 (*)    Chloride 96 (*)    CO2 20 (*)    Calcium 8.6 (*)    AST 112 (*)    Total Bilirubin 1.8 (*)    Anion gap 18 (*)    All other components within normal limits  CBC WITH DIFFERENTIAL/PLATELET - Abnormal; Notable for the following components:   WBC 22.8 (*)    MCV 103.0 (*)    MCH 34.6 (*)    Platelets 95 (*)    Neutro Abs 20.1 (*)    Abs Immature Granulocytes 1.07 (*)    All other components within normal limits  CULTURE, BLOOD (ROUTINE X 2)  CULTURE, BLOOD (ROUTINE X 2)  INFLUENZA PANEL BY PCR (TYPE A & B)  ETHANOL  LACTIC ACID, PLASMA  URINALYSIS, ROUTINE W REFLEX MICROSCOPIC  RAPID URINE DRUG SCREEN, HOSP PERFORMED  CK    EKG EKG Interpretation  Date/Time:  Friday October 02 2018 21:12:32 EST Ventricular Rate:  148 PR Interval:    QRS Duration: 97 QT Interval:  281 QTC Calculation: 441 R Axis:   163 Text Interpretation:  Sinus tachycardia Paired ventricular premature complexes Probable left atrial enlargement Probable lateral infarct, age indeterminate Since last tracing rate faster Confirmed by Dorie Rank [phone]) on 10/02/2018 9:14:42 PM   Radiology Dg Chest 2 View  Result Date: 10/02/2018 CLINICAL DATA:  Cough, chest pain, and shortness of breath.  Fever. EXAM: CHEST - 2 VIEW COMPARISON:  None. FINDINGS: The cardiomediastinal silhouette is within normal limits. Patchy airspace opacities are present in both lung bases with focal, rather dense consolidation in the right middle lobe abutting the major fissure. Retrocardiac opacity is present in the left lower lobe. No pleural effusion or pneumothorax is identified. No acute osseous abnormality is seen. IMPRESSION: Bibasilar consolidation consistent with pneumonia. Electronically Signed   By: Logan Bores M.D.   On: 10/02/2018 21:44    Procedures .Critical Care Performed by: Domenic Moras, PA-C Authorized by: Domenic Moras, PA-C   Critical care provider statement:    Critical care time (minutes):  45   Critical  care was time spent personally by me on the following activities:  Discussions with consultants, evaluation of patient's response to treatment, examination of patient, ordering and performing treatments and interventions, ordering and review of laboratory studies, ordering and review of radiographic studies, pulse oximetry, re-evaluation of patient's condition, obtaining history from patient or surrogate and review of old charts   (including critical care time)  Medications Ordered in ED Medications  sodium chloride flush (NS) 0.9 %  injection 3 mL (has no administration in time range)  sodium chloride 0.9 % bolus 1,000 mL (1,000 mLs Intravenous New Bag/Given 10/02/18 2252)    And  sodium chloride 0.9 % bolus 1,000 mL (1,000 mLs Intravenous New Bag/Given 10/02/18 2208)    And  sodium chloride 0.9 % bolus 500 mL (0 mLs Intravenous Stopped 10/02/18 2239)  cefTRIAXone (ROCEPHIN) 2 g in sodium chloride 0.9 % 100 mL IVPB ( Intravenous Stopped 10/02/18 2244)  azithromycin (ZITHROMAX) 500 mg in sodium chloride 0.9 % 250 mL IVPB (500 mg Intravenous New Bag/Given 10/02/18 2251)  LORazepam (ATIVAN) injection 0-4 mg (1 mg Intravenous Given 10/02/18 2257)    Or  LORazepam (ATIVAN) tablet 0-4 mg ( Oral See Alternative 10/02/18 2257)  LORazepam (ATIVAN) injection 0-4 mg (has no administration in time range)    Or  LORazepam (ATIVAN) tablet 0-4 mg (has no administration in time range)  thiamine (VITAMIN B-1) tablet 100 mg (has no administration in time range)    Or  thiamine (B-1) injection 100 mg (has no administration in time range)  acetaminophen (TYLENOL) tablet 650 mg (650 mg Oral Given 10/02/18 2130)     Initial Impression / Assessment and Plan / ED Course  I have reviewed the triage vital signs and the nursing notes.  Pertinent labs & imaging results that were available during my care of the patient were reviewed by me and considered in my medical decision making (see chart for details).     BP (!)  150/97   Pulse (!) 123   Temp (!) 102.6 F (39.2 C) (Oral)   Resp (!) 42   SpO2 93%    Final Clinical Impressions(s) / ED Diagnoses   Final diagnoses:  Pneumonia of both lungs due to infectious organism, unspecified part of lung  Sepsis, due to unspecified organism, unspecified whether acute organ dysfunction present [facility])    ED Discharge Orders    None     9:59 PM Patient with flulike symptoms.  Came in he is febrile at 102.6, tachycardia with heart rate of 147 but no hypotension.  Initial chest x-ray shows bilateral infiltrate concerning for pneumonia.  Code sepsis initiated, will check flu test.  11:01 PM Lactic acid is 4.2.  WBC 22.8, influenza panel pending.  Patient currently receive Rocephin and Zithromax for community-acquired pneumonia.  IV fluid resuscitation at 30 mL/kg.  EKG shows sinus tachycardia.  SInce pt has hx of alcohol abuse, CIWA protocol initiated.  Care discussed with Dr. Johnney Killian.    11:18 PM Appreciate consultation with [facility] DR. Shanon Brow who agrees to see and admit pt for further care. PT is aware of plan and agrees with plan.   Sepsis reassessment done.      Domenic Moras, PA-C 10/02/18 3474    Charlesetta Shanks, MD 10/03/18 [phone]

## 2018-10-02 NOTE — ED Triage Notes (Addendum)
Pt reports coughing, chest pain, and shortness of breath early this morning. Pt reports an episode of vomiting last night. Pt is febrile and tachycardic in triage. Pt reports mid chest pain/burning. Pt also reports left flank pain but denies urinary symptoms.

## 2018-10-02 NOTE — ED Notes (Signed)
Spoke with Cherre Robins in the lab, lab to add on CK

## 2018-10-03 DIAGNOSIS — J189 Pneumonia, unspecified organism: Secondary | ICD-10-CM

## 2018-10-03 LAB — CBC WITH DIFFERENTIAL/PLATELET
ABS IMMATURE GRANULOCYTES: 1.14 10*3/uL — AB (ref 0.00–0.07)
Basophils Absolute: 0.1 10*3/uL (ref 0.0–0.1)
Basophils Relative: 0 %
Eosinophils Absolute: 0 10*3/uL (ref 0.0–0.5)
Eosinophils Relative: 0 %
HCT: 43.1 % (ref 39.0–52.0)
Hemoglobin: 14.3 g/dL (ref 13.0–17.0)
IMMATURE GRANULOCYTES: 5 %
Lymphocytes Relative: 5 %
Lymphs Abs: 1.1 10*3/uL (ref 0.7–4.0)
MCH: 34.6 pg — ABNORMAL HIGH (ref 26.0–34.0)
MCHC: 33.2 g/dL (ref 30.0–36.0)
MCV: 104.4 fL — ABNORMAL HIGH (ref 80.0–100.0)
Monocytes Absolute: 0.5 10*3/uL (ref 0.1–1.0)
Monocytes Relative: 3 %
NRBC: 0 % (ref 0.0–0.2)
Neutro Abs: 18.3 10*3/uL — ABNORMAL HIGH (ref 1.7–7.7)
Neutrophils Relative %: 87 %
Platelets: 71 10*3/uL — ABNORMAL LOW (ref 150–400)
RBC: 4.13 MIL/uL — ABNORMAL LOW (ref 4.22–5.81)
RDW: 13.6 % (ref 11.5–15.5)
WBC: 21.1 10*3/uL — ABNORMAL HIGH (ref 4.0–10.5)

## 2018-10-03 LAB — BASIC METABOLIC PANEL
Anion gap: 14 (ref 5–15)
BUN: 7 mg/dL (ref 6–20)
CALCIUM: 7.6 mg/dL — AB (ref 8.9–10.3)
CO2: 19 mmol/L — ABNORMAL LOW (ref 22–32)
Chloride: 100 mmol/L (ref 98–111)
Creatinine, Ser: 1.14 mg/dL (ref 0.61–1.24)
GFR calc non Af Amer: >60 mL/min (ref >60)
Glucose, Bld: 74 mg/dL (ref 70–99)
Potassium: 4.1 mmol/L (ref 3.5–5.1)
SODIUM: 133 mmol/L — AB (ref 135–145)

## 2018-10-03 LAB — RAPID URINE DRUG SCREEN, HOSP PERFORMED
Amphetamines: NOT DETECTED
BENZODIAZEPINES: NOT DETECTED
Barbiturates: NOT DETECTED
Cocaine: NOT DETECTED
Opiates: NOT DETECTED
Tetrahydrocannabinol: NOT DETECTED

## 2018-10-03 LAB — STREP PNEUMONIAE URINARY ANTIGEN: Strep Pneumo Urinary Antigen: POSITIVE — AB

## 2018-10-03 LAB — LACTIC ACID, PLASMA: Lactic Acid, Venous: 3.3 mmol/L (ref 0.5–1.9)

## 2018-10-03 MED ORDER — FOLIC ACID 1 MG PO TABS
1.0000 mg | ORAL_TABLET | Freq: Every day | ORAL | Status: DC
  Administered 2018-10-03 – 2018-10-04 (×2): 1 mg via ORAL
  Filled 2018-10-03 (×2): qty 1

## 2018-10-03 MED ORDER — ADULT MULTIVITAMIN W/MINERALS CH
1.0000 | ORAL_TABLET | Freq: Every day | ORAL | Status: DC
  Administered 2018-10-03 – 2018-10-04 (×2): 1 via ORAL
  Filled 2018-10-03 (×2): qty 1

## 2018-10-03 MED ORDER — HYDRALAZINE HCL 20 MG/ML IJ SOLN: 5.0000 mg | INTRAMUSCULAR | Status: DC | PRN

## 2018-10-03 MED ORDER — THIAMINE HCL 100 MG/ML IJ SOLN: 100.0000 mg | Freq: Every day | INTRAMUSCULAR | Status: DC

## 2018-10-03 MED ORDER — LORAZEPAM 1 MG PO TABS: 0.0000 mg | ORAL_TABLET | Freq: Two times a day (BID) | ORAL | Status: DC

## 2018-10-03 MED ORDER — IBUPROFEN 800 MG PO TABS
ORAL_TABLET | ORAL | Status: AC
  Administered 2018-10-03: 800 mg via ORAL
  Filled 2018-10-03: qty 1

## 2018-10-03 MED ORDER — IBUPROFEN 800 MG PO TABS
800.0000 mg | ORAL_TABLET | ORAL | Status: DC | PRN
  Administered 2018-10-03: 800 mg via ORAL

## 2018-10-03 MED ORDER — LORAZEPAM 2 MG/ML IJ SOLN: 1.0000 mg | Freq: Four times a day (QID) | INTRAMUSCULAR | Status: DC | PRN

## 2018-10-03 MED ORDER — LORAZEPAM 1 MG PO TABS: 0.0000 mg | ORAL_TABLET | Freq: Four times a day (QID) | ORAL | Status: DC

## 2018-10-03 MED ORDER — LORAZEPAM 1 MG PO TABS: 0.0000 mg | ORAL_TABLET | ORAL | Status: DC | PRN

## 2018-10-03 MED ORDER — SODIUM CHLORIDE 0.9 % IV SOLN
INTRAVENOUS | Status: DC
  Administered 2018-10-03 – 2018-10-04 (×2): via INTRAVENOUS

## 2018-10-03 MED ORDER — THIAMINE HCL 100 MG/ML IJ SOLN
Freq: Once | INTRAVENOUS | Status: AC
  Administered 2018-10-03: 04:00:00 via INTRAVENOUS
  Filled 2018-10-03: qty 1000

## 2018-10-03 MED ORDER — SODIUM CHLORIDE 0.9 % IV BOLUS
1000.0000 mL | Freq: Once | INTRAVENOUS | Status: AC
  Administered 2018-10-03: 1000 mL via INTRAVENOUS

## 2018-10-03 MED ORDER — LORAZEPAM 1 MG PO TABS: 1.0000 mg | ORAL_TABLET | Freq: Four times a day (QID) | ORAL | Status: DC | PRN

## 2018-10-03 MED ORDER — VITAMIN B-1 100 MG PO TABS
100.0000 mg | ORAL_TABLET | Freq: Every day | ORAL | Status: DC
  Administered 2018-10-03 – 2018-10-04 (×2): 100 mg via ORAL
  Filled 2018-10-03 (×2): qty 1

## 2018-10-03 MED ORDER — LORAZEPAM 1 MG PO TABS: 1.0000 mg | ORAL_TABLET | ORAL | Status: DC | PRN

## 2018-10-03 NOTE — Progress Notes (Signed)
Pt has arrived from the ED - ambulatory to the room , oriented to 5E. Abnormal VSs place pt in the RED MEWS.

## 2018-10-03 NOTE — Progress Notes (Signed)
[facility] PROGRESS NOTE  Michael Hobbs UPJ:[id number] DOB: August 03, 1976 DOA: 10/02/2018 PCP: Patient, No Pcp Per   Narrative: 43 year old African-American male Known history of chronic ethanolism drinks alkaline today Previous admission subarachnoid hemorrhage seizure 11/30/2012 where he left AGAINST MEDICAL ADVICE in the setting of having a seizure likely from alcohol withdrawal Hypertension  Admit 10/03/2018 several days cough cold symptoms in addition to some element of nausea  On admission sodium 133 BUN/creatinine 7/1.1 lactic acid 4.2 cycled and down to 3.3 WBC 21 Hemoglobin 14 platelets 71 Blood cultures obtained-flu panel negative  A & Plan Probable community-acquired pneumonia Follow strep pneumonia urinary antigen, add Legionella Continue azithromycin ceftriaxone IV Cycle lactic acid continue fluids with banana bag 100 cc/h and then IV fluids 125 cc/h subsequently Follow blood culture, urine culture, Gram stainAdmitted to the hospital as per my partner's note Lactic acidosis is probably multifactorial from ethanolism in addition to infectious causes he can stay on the floor at this time unless situation changes Ethanolism, prior history of subarachnoid bleed Drinks half pint a day-increase protocol for Ativan to every 4 hours nursing to notify if becomes more agitated and CIWA above 10 on 2 separate occasions may add Librium Unlikely will quit HTN Recommend holding HCTZ at this time-PRN's will be placed Thrombocytopenia Probably some element of alcoholic liver disease cycle LFTs in a.m. Mild obesity    Verlon Au, MD  [facility] Via [employer] app OR [email or web address] 7PM-7AM contact night coverage as above 10/03/2018, 8:09 AM  LOS: 1 day   Consultants:  None at this time  Procedures:  X-rays after 2  Antimicrobials:  Ceftriaxone azithromycin  Interval history/Subjective: Awake alert pleasant feels somewhat better plan is slightly sleepy No distress No  overt signs of withdrawal on exam Was nauseous until Thursday but is now asking for diet No chest pain No blurred vision no double vision  Objective:  Vitals:  Vitals:   10/03/18 0715 10/03/18 0800  BP: (!) 123/95 (!) 121/92  Pulse: 96 89  Resp:    Temp:    SpO2:      Exam:  Awake alert pleasant no distress EOMI NCAT no icterus no pallor no lymphadenopathy flat affect Chest is clear no egophony no tactile vocal fremitus or resonance Abdomen is soft nontender with no rebound no guarding no lower extremity edema Neurologically is intact moving all 4 limbs equally smile symmetrical musculoskeletal is intact   I have personally reviewed the following:  DATA   Labs:  Lactic acid 4.2-->3.3  White count 21.1 down from 22.8  Platelets 71    Scheduled Meds: . folic acid  1 mg Oral Daily  . [START ON 10/06/2018] LORazepam  0-4 mg Oral Q12H  . multivitamin with minerals  1 tablet Oral Daily  . thiamine  100 mg Oral Daily   Or  . thiamine  100 mg Intravenous Daily   Continuous Infusions: . azithromycin Stopped (10/02/18 2359)  . cefTRIAXone (ROCEPHIN)  IV Stopped (10/02/18 2244)    Principal Problem:   CAP (community acquired pneumonia) Active Problems:   Alcoholism ([place])   PNA (pneumonia)   LOS: 1 day

## 2018-10-03 NOTE — Progress Notes (Signed)
CRITICAL VALUE ALERT  Critical Value: Lactic acid   Date & Time Notied:  10/03/18 @ 0205  Provider Notified: yes- X Blount, BP via Amion text   Orders Received/Actions taken: pending

## 2018-10-03 NOTE — Progress Notes (Signed)
Notified Samtani MD about pts lactic acid level being 3.3. Will continue to monitor and carry out orders.

## 2018-10-03 NOTE — H&P (Signed)
History and Physical    Michael Hobbs VZD:[id number] DOB: [birth year] DOA: 10/02/2018  PCP: Patient, No Pcp Per  Patient coming from: Home  Chief Complaint: Fever cough shortness of breath  HPI: Michael Hobbs is a 43 y.o. male with medical history significant of alcohol abuse comes in with several days of coughing fever and shortness of breath for several days.  Patient is been nauseous.  Been feeling awful.  He has not been on any recent antibiotics.  He denies any hemoptysis.  He denies any withdrawal symptoms.  Patient orienting normally.  Denies any lower extremity edema or swelling.  Patient found to have pneumonia referred for admission for such.  His last drink was 24 hours ago he usually drinks half a pint of brandy a day.  Review of Systems: As per HPI otherwise 10 point review of systems negative.   Past Medical History:  Diagnosis Date  . Alcohol abuse    fell in 2006 and had "brain bleed"  . Seizures ([place])     Past Surgical History:  Procedure Laterality Date  . HERNIA REPAIR    . right shoulder surgery.       reports that he has been smoking cigars. He has been smoking about 1.00 pack per day. He has never used smokeless tobacco. He reports current alcohol use. He reports that he does not use drugs.  No Known Allergies  Family History  Problem Relation Age of Onset  . Stroke Father     Prior to Admission medications   Medication Sig Start Date End Date Taking? Authorizing Provider  hydrochlorothiazide (HYDRODIURIL) 12.5 MG tablet Take 12.5 mg by mouth daily. 08/23/18  Yes [provider]  Vitamin D, Ergocalciferol, (DRISDOL) 1.25 MG (50000 UT) CAPS capsule Take 50,000 Units by mouth once a week. Take 1 capsule by mouth every week for 8 weeks--pt actually takes every 10 days. 08/23/18  Yes [provider]    Physical Exam: Vitals:   10/02/18 2308 10/02/18 2330 10/03/18 0000 10/03/18 0017  BP:  (!) 137/94 (!) 138/93   Pulse:  (!) 129 (!) 123     Resp:  (!) 32 (!) 41   Temp: (!) 102.6 F (39.2 C)     TempSrc: Oral     SpO2:  94% 90%   Weight:    90.7 kg  Height:    6' (1.829 m)    Constitutional: NAD, calm, comfortable Vitals:   10/02/18 2308 10/02/18 2330 10/03/18 0000 10/03/18 0017  BP:  (!) 137/94 (!) 138/93   Pulse:  (!) 129 (!) 123   Resp:  (!) 32 (!) 41   Temp: (!) 102.6 F (39.2 C)     TempSrc: Oral     SpO2:  94% 90%   Weight:    90.7 kg  Height:    6' (1.829 m)   Eyes: PERRL, lids and conjunctivae normal ENMT: Mucous membranes are moist. Posterior pharynx clear of any exudate or lesions.Normal dentition.  Neck: normal, supple, no masses, no thyromegaly Respiratory: clear to auscultation bilaterally, no wheezing, no crackles. Normal respiratory effort. No accessory muscle use.  Cardiovascular: Regular rate and rhythm, no murmurs / rubs / gallops. No extremity edema. 2+ pedal pulses. No carotid bruits.  Abdomen: no tenderness, no masses palpated. No hepatosplenomegaly. Bowel sounds positive.  Musculoskeletal: no clubbing / cyanosis. No joint deformity upper and lower extremities. Good ROM, no contractures. Normal muscle tone.  Skin: no rashes, lesions, ulcers. No induration Neurologic: CN 2-12  grossly intact. Sensation intact, DTR normal. Strength 5/5 in all 4.  Psychiatric: Normal judgment and insight. Alert and oriented x 3. Normal mood.    Labs on Admission: I have personally reviewed following labs and imaging studies  CBC: Recent Labs  Lab 10/02/18 2204  WBC 22.8*  NEUTROABS 20.1*  HGB 15.2  HCT 45.2  MCV 103.0*  PLT 95*   Basic Metabolic Panel: Recent Labs  Lab 10/02/18 2204  NA 134*  K 3.7  CL 96*  CO2 20*  GLUCOSE 74  BUN 7  CREATININE 1.24  CALCIUM 8.6*   GFR: Estimated Creatinine Clearance: 85.2 mL/min (by C-G formula based on SCr of 1.24 mg/dL). Liver Function Tests: Recent Labs  Lab 10/02/18 2204  AST 112*  ALT 38  ALKPHOS 55  BILITOT 1.8*  PROT 7.7  ALBUMIN 4.4    No results for input(s): LIPASE, AMYLASE in the last 168 hours. No results for input(s): AMMONIA in the last 168 hours. Coagulation Profile: No results for input(s): INR, PROTIME in the last 168 hours. Cardiac Enzymes: Recent Labs  Lab 10/02/18 2204  CKTOTAL 84   BNP (last 3 results) No results for input(s): PROBNP in the last 8760 hours. HbA1C: No results for input(s): HGBA1C in the last 72 hours. CBG: No results for input(s): GLUCAP in the last 168 hours. Lipid Profile: No results for input(s): CHOL, HDL, LDLCALC, TRIG, CHOLHDL, LDLDIRECT in the last 72 hours. Thyroid Function Tests: No results for input(s): TSH, T4TOTAL, FREET4, T3FREE, THYROIDAB in the last 72 hours. Anemia Panel: No results for input(s): VITAMINB12, FOLATE, FERRITIN, TIBC, IRON, RETICCTPCT in the last 72 hours. Urine analysis:    Component Value Date/Time   COLORURINE YELLOW 01/13/2018 1901   APPEARANCEUR CLEAR 01/13/2018 1901   LABSPEC 1.028 01/13/2018 1901   PHURINE 6.0 01/13/2018 1901   GLUCOSEU NEGATIVE 01/13/2018 1901   HGBUR NEGATIVE 01/13/2018 1901   BILIRUBINUR NEGATIVE 01/13/2018 1901   KETONESUR 5 (A) 01/13/2018 1901   PROTEINUR NEGATIVE 01/13/2018 1901   NITRITE NEGATIVE 01/13/2018 1901   LEUKOCYTESUR NEGATIVE 01/13/2018 1901   Sepsis Labs: !!!!!!!!!!!!!!!!!!!!!!!!!!!!!!!!!!!!!!!!!!!! @LABRCNTIP (procalcitonin:4,lacticidven:4) ) Recent Results (from the past 240 hour(s))  Blood culture (routine x 2)     Status: None (Preliminary result)   Collection Time: 10/02/18 10:05 PM  Result Value Ref Range Status   Specimen Description   Final    BLOOD RIGHT ARM Performed at [facility], [place] [address], [place], [place] [address]    Special Requests   Final    BOTTLES DRAWN AEROBIC AND ANAEROBIC Blood Culture adequate volume Performed at [place] [address], [place], [place] [address]    Culture PENDING  Incomplete   Report Status PENDING  Incomplete   Blood culture (routine x 2)     Status: None (Preliminary result)   Collection Time: 10/02/18 10:05 PM  Result Value Ref Range Status   Specimen Description   Final    BLOOD LEFT ARM Performed at [facility], [place] [address] [place], [place] [address]    Special Requests   Final    BOTTLES DRAWN AEROBIC AND ANAEROBIC Blood Culture results may not be optimal due to an excessive volume of blood received in culture bottles Performed at [facility] [address], [place], [place] [address]    Culture PENDING  Incomplete   Report Status PENDING  Incomplete     Radiological Exams on Admission: Dg Chest 2 View  Result Date:  10/02/2018 CLINICAL DATA:  Cough, chest pain, and shortness of breath.  Fever. EXAM: CHEST - 2 VIEW COMPARISON:  None. FINDINGS: The cardiomediastinal silhouette is within normal limits. Patchy airspace opacities are present in both lung bases with focal, rather dense consolidation in the right middle lobe abutting the major fissure. Retrocardiac opacity is present in the left lower lobe. No pleural effusion or pneumothorax is identified. No acute osseous abnormality is seen. IMPRESSION: Bibasilar consolidation consistent with pneumonia. Electronically Signed   By: Logan Bores M.D.   On: 10/02/2018 21:44   Old chart reviewed Case discussed with bowie in the ED Chest x-ray reviewed bilateral consolidation   Assessment/Plan 43 year old male with community-acquired pneumonia Principal Problem:   CAP (community acquired pneumonia)-IV Rocephin and azithromycin.  Patient not septic.  Fever of 103.  Give Tylenol and ibuprofen.  Obtain blood cultures and sputum cultures.  Influenza negative.  Active Problems:   Alcoholism ([facility])-placed on CIWA protocol.  He is received a dose of Ativan in emergency department.  Nursing emergency department reviewed reports that he was having some tremors earlier.  He has no evidence of withdrawal at this time.  Give  banana bag.    DVT prophylaxis: SCDs and ambulate Code Status: Full Family Communication: None Disposition Plan: 1 to 3 days Consults called: None Admission status: Admission   Deke Tilghman A MD [facility]  If 7PM-7AM, please contact night-coverage [email or web address] Password [facility]  10/03/2018, 12:25 AM

## 2018-10-03 NOTE — ED Notes (Signed)
ED TO INPATIENT HANDOFF REPORT  Name/Age/Gender Michael Hobbs 43 y.o. male  Code Status Code Status History    Date Active Date Inactive Code Status Order ID Comments User Context   12/01/2012 0317 12/02/2012 1205 Full Code [id number]  Rise Patience, MD Inpatient      Home/SNF/Other Home  Chief Complaint SOB/Flank Pain/Headache with Coughing  Level of Care/Admitting Diagnosis ED Disposition    ED Disposition Condition [place] Area: [facility] [[id number]]  Level of Care: Med-Surg [16]  Diagnosis: PNA (pneumonia) [034742]  Admitting Physician: Phillips Grout [[id number]]  Attending Physician: Derrill Kay A [[id number]]  Estimated length of stay: 3 - 4 days  Certification:: I certify this patient will need inpatient services for at least 2 midnights  PT Class (Do Not Modify): Inpatient [101]  PT Acc Code (Do Not Modify): Private [1]       Medical History Past Medical History:  Diagnosis Date  . Alcohol abuse    fell in 2006 and had "brain bleed"  . Seizures ([place])     Allergies No Known Allergies  IV Location/Drains/Wounds Patient Lines/Drains/Airways Status   Active Line/Drains/Airways    Name:   Placement date:   Placement time:   Site:   Days:   Peripheral IV 10/02/18 Left;Lateral Forearm   10/02/18    2150    Forearm   1   Peripheral IV 10/02/18 Right;Anterior Forearm   10/02/18    2155    Forearm   1   Wound 12/01/12 Abrasion(s) Nose Medial   12/01/12    0354    Nose   2132          Labs/Imaging Results for orders placed or performed during the hospital encounter of 10/02/18 (from the past 48 hour(s))  Influenza panel by PCR (type A & B)     Status: None   Collection Time: 10/02/18  9:51 PM  Result Value Ref Range   Influenza A By PCR NEGATIVE NEGATIVE   Influenza B By PCR NEGATIVE NEGATIVE    Comment: (NOTE) The Xpert Xpress Flu assay is intended as an aid in the diagnosis of  influenza and should not be used as a  sole basis for treatment.  This  assay is FDA approved for nasopharyngeal swab specimens only. Nasal  washings and aspirates are unacceptable for Xpert Xpress Flu testing. Performed at [facility], [place] [address], [place], [place] [address]   Comprehensive metabolic panel     Status: Abnormal   Collection Time: 10/02/18 10:04 PM  Result Value Ref Range   Sodium 134 (L) 135 - 145 mmol/L   Potassium 3.7 3.5 - 5.1 mmol/L   Chloride 96 (L) 98 - 111 mmol/L   CO2 20 (L) 22 - 32 mmol/L   Glucose, Bld 74 70 - 99 mg/dL   BUN 7 6 - 20 mg/dL   Creatinine, Ser 1.24 0.61 - 1.24 mg/dL   Calcium 8.6 (L) 8.9 - 10.3 mg/dL   Total Protein 7.7 6.5 - 8.1 g/dL   Albumin 4.4 3.5 - 5.0 g/dL   AST 112 (H) 15 - 41 U/L   ALT 38 0 - 44 U/L   Alkaline Phosphatase 55 38 - 126 U/L   Total Bilirubin 1.8 (H) 0.3 - 1.2 mg/dL   GFR calc non Af Amer >60 >60 mL/min   GFR calc Af Amer >60 >60 mL/min   Anion gap 18 (H) 5 - 15  Comment: Performed at [facility], [place] [address] [place], [place] [address]  CBC with Differential     Status: Abnormal   Collection Time: 10/02/18 10:04 PM  Result Value Ref Range   WBC 22.8 (H) 4.0 - 10.5 K/uL    Comment: REPEATED TO VERIFY WHITE COUNT CONFIRMED ON SMEAR    RBC 4.39 4.22 - 5.81 MIL/uL   Hemoglobin 15.2 13.0 - 17.0 g/dL   HCT 45.2 39.0 - 52.0 %   MCV 103.0 (H) 80.0 - 100.0 fL   MCH 34.6 (H) 26.0 - 34.0 pg   MCHC 33.6 30.0 - 36.0 g/dL   RDW 13.5 11.5 - 15.5 %   Platelets 95 (L) 150 - 400 K/uL    Comment: REPEATED TO VERIFY PLATELET COUNT CONFIRMED BY SMEAR SPECIMEN CHECKED FOR CLOTS Immature Platelet Fraction may be clinically indicated, consider ordering this additional test [id number]    nRBC 0.0 0.0 - 0.2 %   Neutrophils Relative % 88 %   Neutro Abs 20.1 (H) 1.7 - 7.7 K/uL   Lymphocytes Relative 4 %   Lymphs Abs 1.0 0.7 - 4.0 K/uL   Monocytes Relative 3 %   Monocytes Absolute 0.6 0.1 - 1.0 K/uL   Eosinophils  Relative 0 %   Eosinophils Absolute 0.0 0.0 - 0.5 K/uL   Basophils Relative 0 %   Basophils Absolute 0.1 0.0 - 0.1 K/uL   WBC Morphology DOHLE BODIES     Comment: VACUOLATED NEUTROPHILS   Immature Granulocytes 5 %   Abs Immature Granulocytes 1.07 (H) 0.00 - 0.07 K/uL   Polychromasia PRESENT     Comment: Performed at [facility], [place] [address] [place], [place] [address]  CK     Status: None   Collection Time: 10/02/18 10:04 PM  Result Value Ref Range   Total CK 84 49 - 397 U/L    Comment: Performed at [facility], [place] [address] [place], [place] [address]  Lactic acid, plasma     Status: Abnormal   Collection Time: 10/02/18 10:05 PM  Result Value Ref Range   Lactic Acid, Venous 4.2 (HH) 0.5 - 1.9 mmol/L    Comment: CRITICAL RESULT CALLED TO, READ BACK BY AND VERIFIED WITH: Nneoma Harral,T RN @2254  ON 10/02/2018 JACKSON,K Performed at [facility], [place] [address] [place], [place] [address]   Blood culture (routine x 2)     Status: None (Preliminary result)   Collection Time: 10/02/18 10:05 PM  Result Value Ref Range   Specimen Description      BLOOD RIGHT ARM Performed at [facility], [place] [address], [place], [place] [address]    Special Requests      BOTTLES DRAWN AEROBIC AND ANAEROBIC Blood Culture adequate volume Performed at [place] [address], [place], [place] [address]    Culture PENDING    Report Status PENDING   Blood culture (routine x 2)     Status: None (Preliminary result)   Collection Time: 10/02/18 10:05 PM  Result Value Ref Range   Specimen Description      BLOOD LEFT ARM Performed at [place] [address] [place], [place] [address]    Special Requests      BOTTLES DRAWN AEROBIC AND ANAEROBIC Blood Culture results may not be optimal due to an excessive volume of blood received in culture bottles Performed at [facility] [address] [place],  [address]  Culture PENDING    Report Status PENDING   Ethanol     Status: None   Collection Time: 10/02/18 10:05 PM  Result Value Ref Range   Alcohol, Ethyl (B) <10 <10 mg/dL    Comment: (NOTE) Lowest detectable limit for serum alcohol is 10 mg/dL. For medical purposes only. Performed at [facility], [place] [address], [place], [place] [address]    Dg Chest 2 View  Result Date: 10/02/2018 CLINICAL DATA:  Cough, chest pain, and shortness of breath.  Fever. EXAM: CHEST - 2 VIEW COMPARISON:  None. FINDINGS: The cardiomediastinal silhouette is within normal limits. Patchy airspace opacities are present in both lung bases with focal, rather dense consolidation in the right middle lobe abutting the major fissure. Retrocardiac opacity is present in the left lower lobe. No pleural effusion or pneumothorax is identified. No acute osseous abnormality is seen. IMPRESSION: Bibasilar consolidation consistent with pneumonia. Electronically Signed   By: Logan Bores M.D.   On: 10/02/2018 21:44   EKG Interpretation  Date/Time:  Friday October 02 2018 21:12:32 EST Ventricular Rate:  148 PR Interval:    QRS Duration: 97 QT Interval:  281 QTC Calculation: 441 R Axis:   163 Text Interpretation:  Sinus tachycardia Paired ventricular premature complexes Probable left atrial enlargement Probable lateral infarct, age indeterminate Since last tracing rate faster Confirmed by Dorie Rank [phone]) on 10/02/2018 9:14:42 PM   Pending Labs Unresulted Labs (From admission, onward)    Start     Ordered   10/02/18 2228  Urine rapid drug screen (hosp performed)  ONCE - STAT,   STAT     10/02/18 2227   10/02/18 2123  Lactic acid, plasma  Now then every 2 hours,   STAT     10/02/18 2122   10/02/18 2123  Urinalysis, Routine w reflex microscopic  ONCE - STAT,   STAT     10/02/18 2122          Vitals/Pain Today's Vitals   10/02/18 2300 10/02/18 2308 10/02/18 2330 10/03/18  0000  BP: (!) 143/93  (!) 137/94 (!) 138/93  Pulse: (!) 123  (!) 129 (!) 123  Resp: (!) 45  (!) 32 (!) 41  Temp:  (!) 102.6 F (39.2 C)    TempSrc:  Oral    SpO2: 95%  94% 90%  PainSc:        Isolation Precautions Droplet precaution  Medications Medications  sodium chloride flush (NS) 0.9 % injection 3 mL (has no administration in time range)  cefTRIAXone (ROCEPHIN) 2 g in sodium chloride 0.9 % 100 mL IVPB ( Intravenous Stopped 10/02/18 2244)  azithromycin (ZITHROMAX) 500 mg in sodium chloride 0.9 % 250 mL IVPB (500 mg Intravenous New Bag/Given 10/02/18 2251)  LORazepam (ATIVAN) injection 0-4 mg (1 mg Intravenous Given 10/02/18 2257)    Or  LORazepam (ATIVAN) tablet 0-4 mg ( Oral See Alternative 10/02/18 2257)  LORazepam (ATIVAN) injection 0-4 mg (has no administration in time range)    Or  LORazepam (ATIVAN) tablet 0-4 mg (has no administration in time range)  thiamine (VITAMIN B-1) tablet 100 mg (has no administration in time range)    Or  thiamine (B-1) injection 100 mg (has no administration in time range)  ibuprofen (ADVIL,MOTRIN) tablet 800 mg (800 mg Oral Given 10/03/18 0005)  acetaminophen (TYLENOL) tablet 650 mg (650 mg Oral Given 10/02/18 2130)  sodium chloride 0.9 % bolus 1,000 mL (0 mLs Intravenous Stopped 10/02/18 2344)    And  sodium  chloride 0.9 % bolus 1,000 mL (0 mLs Intravenous Stopped 10/02/18 2344)    And  sodium chloride 0.9 % bolus 500 mL (0 mLs Intravenous Stopped 10/02/18 2239)    Mobility walks

## 2018-10-04 LAB — BASIC METABOLIC PANEL
Anion gap: 9 (ref 5–15)
BUN: 11 mg/dL (ref 6–20)
CO2: 24 mmol/L (ref 22–32)
Calcium: 7.9 mg/dL — ABNORMAL LOW (ref 8.9–10.3)
Chloride: 104 mmol/L (ref 98–111)
Creatinine, Ser: 0.97 mg/dL (ref 0.61–1.24)
GFR calc Af Amer: >60 mL/min (ref >60)
GFR calc non Af Amer: >60 mL/min (ref >60)
GLUCOSE: 88 mg/dL (ref 70–99)
Potassium: 3.7 mmol/L (ref 3.5–5.1)
Sodium: 137 mmol/L (ref 135–145)

## 2018-10-04 LAB — CBC WITH DIFFERENTIAL/PLATELET
Abs Immature Granulocytes: 0.06 10*3/uL (ref 0.00–0.07)
Basophils Absolute: 0 10*3/uL (ref 0.0–0.1)
Basophils Relative: 0 %
EOS PCT: 0 %
Eosinophils Absolute: 0 10*3/uL (ref 0.0–0.5)
HCT: 38.3 % — ABNORMAL LOW (ref 39.0–52.0)
Hemoglobin: 12.8 g/dL — ABNORMAL LOW (ref 13.0–17.0)
Immature Granulocytes: 1 %
Lymphocytes Relative: 14 %
Lymphs Abs: 1.6 10*3/uL (ref 0.7–4.0)
MCH: 34.6 pg — ABNORMAL HIGH (ref 26.0–34.0)
MCHC: 33.4 g/dL (ref 30.0–36.0)
MCV: 103.5 fL — ABNORMAL HIGH (ref 80.0–100.0)
Monocytes Absolute: 0.4 10*3/uL (ref 0.1–1.0)
Monocytes Relative: 3 %
Neutro Abs: 9.2 10*3/uL — ABNORMAL HIGH (ref 1.7–7.7)
Neutrophils Relative %: 82 %
Platelets: 68 10*3/uL — ABNORMAL LOW (ref 150–400)
RBC: 3.7 MIL/uL — ABNORMAL LOW (ref 4.22–5.81)
RDW: 13.6 % (ref 11.5–15.5)
WBC: 11.3 10*3/uL — ABNORMAL HIGH (ref 4.0–10.5)
nRBC: 0 % (ref 0.0–0.2)

## 2018-10-04 LAB — HIV ANTIBODY (ROUTINE TESTING W REFLEX): HIV Screen 4th Generation wRfx: NONREACTIVE

## 2018-10-04 MED ORDER — LIP MEDEX EX OINT
TOPICAL_OINTMENT | CUTANEOUS | Status: DC | PRN
  Administered 2018-10-04: 07:00:00 via TOPICAL

## 2018-10-04 MED ORDER — LIP MEDEX EX OINT
TOPICAL_OINTMENT | CUTANEOUS | Status: AC
  Filled 2018-10-04: qty 7

## 2018-10-04 MED ORDER — LEVOFLOXACIN 500 MG PO TABS
500.0000 mg | ORAL_TABLET | Freq: Every day | ORAL | Status: DC
  Filled 2018-10-04: qty 1

## 2018-10-04 MED ORDER — IBUPROFEN 800 MG PO TABS: 800.0000 mg | ORAL_TABLET | ORAL | 0 refills | Status: DC | PRN

## 2018-10-04 MED ORDER — LEVOFLOXACIN 500 MG PO TABS: 500.0000 mg | ORAL_TABLET | Freq: Every day | ORAL | 0 refills | Status: DC

## 2018-10-04 NOTE — Discharge Summary (Signed)
Physician Discharge Summary  Michael Hobbs KXF:[id number] DOB: November 02, 1975 DOA: 10/02/2018  PCP: Patient, No Pcp Per  Admit date: 10/02/2018 Discharge date: 10/04/2018  Time spent: 25 minutes  Recommendations for Outpatient Follow-up:  1. Complete course of Levaquin for strep pneumo pneumonia 2. Counseled ethanol smoking cessation patient pre-contemplative  Discharge Diagnoses:  Principal Problem:   CAP (community acquired pneumonia) Active Problems:   Alcoholism ([place])   PNA (pneumonia)   Discharge Condition: Improved  Diet recommendation: Heart healthy  Filed Weights   10/03/18 0017  Weight: 90.7 kg    History of present illness:  43 year old African-American male Known history of chronic ethanolism drinks alkaline today Previous admission subarachnoid hemorrhage seizure 11/30/2012 where he left AGAINST MEDICAL ADVICE in the setting of having a seizure likely from alcohol withdrawal Hypertension  Admit 10/03/2018 several days cough cold symptoms in addition to some element of nausea  Admitted with concerns for community-acquired pneumonia lactic acid 4.2 but resolved WBC was 21 and patient had early sepsis physiology  Hospital Course:  Early sepsis secondary to community-acquired pneumonia Strep pneumo came back positive patient was narrowed from azithromycin ceftriaxone IV to Levaquin IV fluids were saline locked Sepsis physiology resolved relatively quickly Ethanolism prior subarachnoid bleed Patient exhibited no/sparse findings of withdrawal Patient was stabilized on discharge and was coherent HTN H-resumed CTZ on discharge Alcohol-related thrombocytopenia Patient should quit drinking   Discharge Exam: Vitals:   10/04/18 0010 10/04/18 0551  BP: (!) 133/93 (!) 127/91  Pulse: 94 87  Resp: 18 18  Temp: 98.7 F (37.1 C) 99.2 F (37.3 C)  SpO2: 93% 94%    General: Awake alert pleasant no distress EOMI NCAT no pallor no icterus no  lymphadenopathy Cardiovascular: S1-S2 no murmur Respiratory: Clinically clear no added sound  Discharge Instructions   Discharge Instructions    Diet - low sodium heart healthy   Complete by:  As directed    Discharge instructions   Complete by:  As directed    Complete all antibiotics Do not smoke Resume your other medications   Increase activity slowly   Complete by:  As directed      Allergies as of 10/04/2018   No Known Allergies     Medication List    TAKE these medications   hydrochlorothiazide 12.5 MG tablet Commonly known as:  HYDRODIURIL Take 12.5 mg by mouth daily.   ibuprofen 800 MG tablet Commonly known as:  ADVIL,MOTRIN Take 1 tablet (800 mg total) by mouth every 4 (four) hours as needed for fever or mild pain.   levofloxacin 500 MG tablet Commonly known as:  LEVAQUIN Take 1 tablet (500 mg total) by mouth daily.   Vitamin D (Ergocalciferol) 1.25 MG (50000 UT) Caps capsule Commonly known as:  DRISDOL Take 50,000 Units by mouth once a week. Take 1 capsule by mouth every week for 8 weeks--pt actually takes every 10 days.      No Known Allergies    The results of significant diagnostics from this hospitalization (including imaging, microbiology, ancillary and laboratory) are listed below for reference.    Significant Diagnostic Studies: Dg Chest 2 View  Result Date: 10/02/2018 CLINICAL DATA:  Cough, chest pain, and shortness of breath.  Fever. EXAM: CHEST - 2 VIEW COMPARISON:  None. FINDINGS: The cardiomediastinal silhouette is within normal limits. Patchy airspace opacities are present in both lung bases with focal, rather dense consolidation in the right middle lobe abutting the major fissure. Retrocardiac opacity is present in the left lower lobe.  No pleural effusion or pneumothorax is identified. No acute osseous abnormality is seen. IMPRESSION: Bibasilar consolidation consistent with pneumonia. Electronically Signed   By: Logan Bores M.D.   On:  10/02/2018 21:44    Microbiology: Recent Results (from the past 240 hour(s))  Blood culture (routine x 2)     Status: None (Preliminary result)   Collection Time: 10/02/18 10:05 PM  Result Value Ref Range Status   Specimen Description   Final    BLOOD RIGHT ARM Performed at [facility], [address], [place], [place] [address]    Special Requests   Final    BOTTLES DRAWN AEROBIC AND ANAEROBIC Blood Culture adequate volume Performed at [facility] [address] [place], [place] [address]    Culture   Final    NO GROWTH 2 DAYS Performed at [facility] [address] [place], [place] [address]    Report Status PENDING  Incomplete  Blood culture (routine x 2)     Status: None (Preliminary result)   Collection Time: 10/02/18 10:05 PM  Result Value Ref Range Status   Specimen Description   Final    BLOOD LEFT ARM Performed at [facility], [place] [address], [place], [place] [address]    Special Requests   Final    BOTTLES DRAWN AEROBIC AND ANAEROBIC Blood Culture results may not be optimal due to an excessive volume of blood received in culture bottles Performed at [facility] [address] [place], [place] [address]    Culture   Final    NO GROWTH 2 DAYS Performed at [facility] [address], [place],  [address]    Report Status PENDING  Incomplete     Labs: Basic Metabolic Panel: Recent Labs  Lab 10/02/18 2204 10/03/18 0118 10/04/18 0548  NA 134* 133* 137  K 3.7 4.1 3.7  CL 96* 100 104  CO2 20* 19* 24  GLUCOSE 74 74 88  BUN 7 7 11   CREATININE 1.24 1.14 0.97  CALCIUM 8.6* 7.6* 7.9*   Liver Function Tests: Recent Labs  Lab 10/02/18 2204  AST 112*  ALT 38  ALKPHOS 55  BILITOT 1.8*  PROT 7.7  ALBUMIN 4.4   No results for input(s): LIPASE, AMYLASE in the last 168 hours. No results for input(s): AMMONIA in the last 168 hours. CBC: Recent Labs  Lab 10/02/18 2204  10/03/18 0118 10/04/18 0548  WBC 22.8* 21.1* 11.3*  NEUTROABS 20.1* 18.3* 9.2*  HGB 15.2 14.3 12.8*  HCT 45.2 43.1 38.3*  MCV 103.0* 104.4* 103.5*  PLT 95* 71* 68*   Cardiac Enzymes: Recent Labs  Lab 10/02/18 2204  CKTOTAL 84   BNP: BNP (last 3 results) No results for input(s): BNP in the last 8760 hours.  ProBNP (last 3 results) No results for input(s): PROBNP in the last 8760 hours.  CBG: No results for input(s): GLUCAP in the last 168 hours.     Signed:  Nita Sells MD   [facility] 10/04/2018, 10:12 AM

## 2018-10-07 LAB — CULTURE, BLOOD (ROUTINE X 2)
Culture: NO GROWTH
Culture: NO GROWTH
Special Requests: ADEQUATE

## 2018-10-14 ENCOUNTER — Ambulatory Visit (HOSPITAL_COMMUNITY)
Admission: RE | Admit: 2018-10-14 | Discharge: 2018-10-14 | Disposition: A | Discharge status: Home / Self Care | Payer: Self-pay | Source: Ambulatory Visit | Attending: Neurology | Admitting: Neurology

## 2018-10-14 DIAGNOSIS — R569 Unspecified convulsions: Secondary | ICD-10-CM

## 2018-10-14 MED ORDER — GADOBUTROL 1 MMOL/ML IV SOLN
9.0000 mL | Freq: Once | INTRAVENOUS | Status: AC | PRN
  Administered 2018-10-14: 9 mL via INTRAVENOUS

## 2018-10-15 ENCOUNTER — Telehealth: Payer: Self-pay | Admitting: Neurology

## 2018-10-15 NOTE — Telephone Encounter (Signed)
Patient left message with after hours service needing to get Lab/ test Results. Please Call. Thanks

## 2018-10-16 NOTE — Telephone Encounter (Signed)
Patient calling regarding MRI results. Please Call. Thanks

## 2018-10-16 NOTE — Telephone Encounter (Signed)
Pt just had MRI 2.27.2020  Will call once I have the results

## 2018-10-20 ENCOUNTER — Telehealth: Payer: Self-pay | Admitting: Neurology

## 2018-10-20 NOTE — Telephone Encounter (Signed)
Patient called back regarding results. Please Call. Thanks

## 2018-10-21 NOTE — Telephone Encounter (Signed)
Called home number. Patient not available. Tried to call cell number, but it is busy.

## 2018-10-21 NOTE — Telephone Encounter (Signed)
-----   Message from Cameron Sprang, MD sent at 10/20/2018  9:13 AM EST ----- Pls let him know the MRI brain is normal, no evidence of tumor, stroke, or bleed. His EEG was also normal. Has he had any more seizures since July? Thanks

## 2018-10-22 NOTE — Telephone Encounter (Signed)
Spoke with pt relaying normal EEG and MRI results.  Pt states that he has recently been approved for Medicaid and that he is just waiting for card to come in the mail.  States that he will be calling our office to give that information once card arrives.

## 2018-10-26 ENCOUNTER — Encounter (HOSPITAL_COMMUNITY): Payer: Self-pay

## 2018-10-26 ENCOUNTER — Emergency Department (HOSPITAL_COMMUNITY)
Admission: EM | Admit: 2018-10-26 | Discharge: 2018-10-26 | Disposition: A | Discharge status: Home / Self Care | Payer: Self-pay | Attending: Emergency Medicine | Admitting: Emergency Medicine

## 2018-10-26 DIAGNOSIS — Z79899 Other long term (current) drug therapy: Secondary | ICD-10-CM | POA: Insufficient documentation

## 2018-10-26 DIAGNOSIS — F1010 Alcohol abuse, uncomplicated: Secondary | ICD-10-CM | POA: Insufficient documentation

## 2018-10-26 DIAGNOSIS — F17210 Nicotine dependence, cigarettes, uncomplicated: Secondary | ICD-10-CM | POA: Insufficient documentation

## 2018-10-26 NOTE — Discharge Instructions (Addendum)
You were seen in the ER for alcohol detox.  Unfortunately our facility does not provide this service.  Please see below for nearby facilities that provide drug and alcohol detox services.  [facility] [phone] [place], [place], [address]  [facility] [phone] [place], [place], [address]  [facility] [phone] [address] [place], [place], [address]  [facility] [phone] [place], [place], [address]  [facility]) [place], [place] [address]  Return to ER for severe anxiety, nausea, vomiting, abdominal pain, tremors, seizures

## 2018-10-26 NOTE — ED Triage Notes (Signed)
Pt arrived stating he called around for substance abuse help, was told to check into the Emergency Room for detox. Alcohol abuse, about 2 16 ounce beers and a pint of brandy daily. Last drink was Friday, then two hours ago today. Denies SI and HI

## 2018-10-27 NOTE — ED Provider Notes (Signed)
[name] DEPT Provider Note   CSN: [id number] Arrival date & time: 10/26/18  1954    History   Chief Complaint Chief Complaint  Patient presents with  . Detox    HPI Michael Hobbs is a 43 y.o. male with h/o ETOH abuse here for evaluation of desire for alcohol detox. States he wants to go to [facility] for detox but was told to go to emergency department first.  Reports many years of ETOH use.  Usually drinks 1 pint of liquor and a couple of beers most days.  Sometimes he goes days without alcohol when he has no money.  Last full day of drinking was 3 days ago, but today had a some beer.  He denies having withdrawal symptoms such as seizures, tremors, headaches, nausea, vomiting, abdominal pain, anxiety in the past.  Currently denies these today.  No interventions. No alleviating/aggravating factors.      HPI  Past Medical History:  Diagnosis Date  . Alcohol abuse    fell in 2006 and had "brain bleed"  . Seizures [facility])     Patient Active Problem List   Diagnosis Date Noted  . CAP (community acquired pneumonia) 10/02/2018  . PNA (pneumonia) 10/02/2018  . Syncope 12/01/2012  . Subarachnoid hematoma ([place]) 12/01/2012  . Nasal fracture 12/01/2012  . Periorbital contusion of left eye 12/01/2012  . Alcoholism ([place]) 12/01/2012    Past Surgical History:  Procedure Laterality Date  . HERNIA REPAIR    . right shoulder surgery.          Home Medications    Prior to Admission medications   Medication Sig Start Date End Date Taking? Authorizing Provider  hydrochlorothiazide (HYDRODIURIL) 12.5 MG tablet Take 12.5 mg by mouth daily. 08/23/18  Yes [provider]  Vitamin D, Ergocalciferol, (DRISDOL) 1.25 MG (50000 UT) CAPS capsule Take 50,000 Units by mouth once a week. Take 1 capsule by mouth every week for 8 weeks--pt actually takes every 10 days. 08/23/18  Yes [provider]  ibuprofen (ADVIL,MOTRIN) 800 MG tablet Take 1 tablet (800  mg total) by mouth every 4 (four) hours as needed for fever or mild pain. Patient not taking: Reported on 10/26/2018 10/04/18   Nita Sells, MD  levofloxacin (LEVAQUIN) 500 MG tablet Take 1 tablet (500 mg total) by mouth daily. Patient not taking: Reported on 10/26/2018 10/04/18   Nita Sells, MD    Family History Family History  Problem Relation Age of Onset  . Stroke Father     Social History Social History   Tobacco Use  . Smoking status: Current Every Day Smoker    Packs/day: 1.00    Types: Cigars  . Smokeless tobacco: Never Used  Substance Use Topics  . Alcohol use: Yes    Comment: 1 pint of beer, and a 1/4 pint of liquor   . Drug use: No     Allergies   Patient has no known allergies.   Review of Systems Review of Systems  All other systems reviewed and are negative.    Physical Exam Updated Vital Signs BP (!) 146/84 (BP Location: Left Arm)   Pulse 100   Temp 98.9 F (37.2 C) (Oral)   Resp 15   Ht 6' (1.829 m)   Wt 81.1 kg   SpO2 100%   BMI 24.24 kg/m   Physical Exam Vitals signs and nursing note reviewed.  Constitutional:      General: He is not in acute distress.  Appearance: He is well-developed.     Comments: NAD.  Laying in hall bed.   HENT:     Head: Normocephalic and atraumatic.     Right Ear: External ear normal.     Left Ear: External ear normal.     Nose: Nose normal.  Eyes:     General: No scleral icterus.    Conjunctiva/sclera: Conjunctivae normal.  Neck:     Musculoskeletal: Normal range of motion and neck supple.  Cardiovascular:     Rate and Rhythm: Normal rate and regular rhythm.     Heart sounds: Normal heart sounds. No murmur.  Pulmonary:     Effort: Pulmonary effort is normal.     Breath sounds: Normal breath sounds. No wheezing.  Musculoskeletal: Normal range of motion.        General: No deformity.  Skin:    General: Skin is warm and dry.     Capillary Refill: Capillary refill takes less than 2  seconds.     Comments: No diaphoresis  Neurological:     Mental Status: He is alert and oriented to person, place, and time.     Comments: No hand tremors   Psychiatric:        Behavior: Behavior normal.        Thought Content: Thought content normal.        Judgment: Judgment normal.      ED Treatments / Results  Labs (all labs ordered are listed, but only abnormal results are displayed) Labs Reviewed - No data to display  EKG None  Radiology No results found.  Procedures Procedures (including critical care time)  Medications Ordered in ED Medications - No data to display   Initial Impression / Assessment and Plan / ED Course  I have reviewed the triage vital signs and the nursing notes.  Pertinent labs & imaging results that were available during my care of the patient were reviewed by me and considered in my medical decision making (see chart for details).        Patient denies any acute withdrawal symptoms.  Offers no other physical complaints to me today.  I see no indication for emergent lab work or imaging here.  Denies previous complicated withdrawals from alcohol.  I explained to patient we do not offer medical detoxing in the ER.  I spoke to representative at [facility] who states patient needs to call tomorrow morning to do a formal screening evaluation and they can refer him for any needed blood work or possibly admit him to their service at that time.  I explained this to patient.  Patient was discharged with multiple outpatient/inpatient detox facilities in the area.  He states he will call our car tomorrow morning to inquire.  I offered Librium prescription however patient states he will likely not take this.  Return precautions were given.  Patient is comfortable with this. Final Clinical Impressions(s) / ED Diagnoses   Final diagnoses:  Alcohol abuse    ED Discharge Orders    None       Kinnie Feil, PA-C 10/27/18 0041    Fatima Blank, MD 10/27/18 1905

## 2019-03-16 ENCOUNTER — Other Ambulatory Visit: Payer: Self-pay

## 2019-03-16 ENCOUNTER — Emergency Department (HOSPITAL_COMMUNITY): Admission: EM | Admit: 2019-03-16 | Discharge: 2019-03-16 | Discharge status: Against Medical Advice | Payer: Self-pay

## 2020-05-23 ENCOUNTER — Emergency Department (HOSPITAL_COMMUNITY): Payer: Self-pay

## 2020-05-23 ENCOUNTER — Other Ambulatory Visit: Payer: Self-pay

## 2020-05-23 ENCOUNTER — Emergency Department (HOSPITAL_COMMUNITY)
Admission: EM | Admit: 2020-05-23 | Discharge: 2020-05-23 | Disposition: A | Discharge status: Home / Self Care | Payer: Self-pay | Attending: Emergency Medicine | Admitting: Emergency Medicine

## 2020-05-23 DIAGNOSIS — S0181XA Laceration without foreign body of other part of head, initial encounter: Secondary | ICD-10-CM | POA: Insufficient documentation

## 2020-05-23 DIAGNOSIS — F17290 Nicotine dependence, other tobacco product, uncomplicated: Secondary | ICD-10-CM | POA: Insufficient documentation

## 2020-05-23 DIAGNOSIS — S20212A Contusion of left front wall of thorax, initial encounter: Secondary | ICD-10-CM | POA: Insufficient documentation

## 2020-05-23 DIAGNOSIS — R569 Unspecified convulsions: Secondary | ICD-10-CM | POA: Insufficient documentation

## 2020-05-23 DIAGNOSIS — W19XXXA Unspecified fall, initial encounter: Secondary | ICD-10-CM | POA: Insufficient documentation

## 2020-05-23 DIAGNOSIS — F1099 Alcohol use, unspecified with unspecified alcohol-induced disorder: Secondary | ICD-10-CM | POA: Insufficient documentation

## 2020-05-23 DIAGNOSIS — S0083XA Contusion of other part of head, initial encounter: Secondary | ICD-10-CM

## 2020-05-23 LAB — CBC WITH DIFFERENTIAL/PLATELET
Abs Immature Granulocytes: 0.04 10*3/uL (ref 0.00–0.07)
Basophils Absolute: 0 10*3/uL (ref 0.0–0.1)
Basophils Relative: 1 %
Eosinophils Absolute: 0 10*3/uL (ref 0.0–0.5)
Eosinophils Relative: 0 %
HCT: 39.6 % (ref 39.0–52.0)
Hemoglobin: 13.2 g/dL (ref 13.0–17.0)
Immature Granulocytes: 1 %
Lymphocytes Relative: 14 %
Lymphs Abs: 0.7 10*3/uL (ref 0.7–4.0)
MCH: 35.5 pg — ABNORMAL HIGH (ref 26.0–34.0)
MCHC: 33.3 g/dL (ref 30.0–36.0)
MCV: 106.5 fL — ABNORMAL HIGH (ref 80.0–100.0)
Monocytes Absolute: 0.4 10*3/uL (ref 0.1–1.0)
Monocytes Relative: 9 %
Neutro Abs: 3.7 10*3/uL (ref 1.7–7.7)
Neutrophils Relative %: 75 %
Platelets: 57 10*3/uL — ABNORMAL LOW (ref 150–400)
RBC: 3.72 MIL/uL — ABNORMAL LOW (ref 4.22–5.81)
RDW: 12.4 % (ref 11.5–15.5)
WBC: 4.9 10*3/uL (ref 4.0–10.5)
nRBC: 0 % (ref 0.0–0.2)

## 2020-05-23 LAB — COMPREHENSIVE METABOLIC PANEL
ALT: 28 U/L (ref 0–44)
AST: 89 U/L — ABNORMAL HIGH (ref 15–41)
Albumin: 4.4 g/dL (ref 3.5–5.0)
Alkaline Phosphatase: 48 U/L (ref 38–126)
Anion gap: 16 — ABNORMAL HIGH (ref 5–15)
BUN: <5 mg/dL — ABNORMAL LOW (ref 6–20)
CO2: 23 mmol/L (ref 22–32)
Calcium: 9.3 mg/dL (ref 8.9–10.3)
Chloride: 95 mmol/L — ABNORMAL LOW (ref 98–111)
Creatinine, Ser: 1.14 mg/dL (ref 0.61–1.24)
GFR calc non Af Amer: >60 mL/min (ref >60)
Glucose, Bld: 132 mg/dL — ABNORMAL HIGH (ref 70–99)
Potassium: 3.7 mmol/L (ref 3.5–5.1)
Sodium: 134 mmol/L — ABNORMAL LOW (ref 135–145)
Total Bilirubin: 1.3 mg/dL — ABNORMAL HIGH (ref 0.3–1.2)
Total Protein: 7.3 g/dL (ref 6.5–8.1)

## 2020-05-23 LAB — PROTIME-INR
INR: 1.1 (ref 0.8–1.2)
Prothrombin Time: 14.2 seconds (ref 11.4–15.2)

## 2020-05-23 LAB — ETHANOL: Alcohol, Ethyl (B): <10 mg/dL (ref <10)

## 2020-05-23 MED ORDER — LIDOCAINE-EPINEPHRINE 1 %-1:100000 IJ SOLN
20.0000 mL | Freq: Once | INTRAMUSCULAR | Status: AC
  Administered 2020-05-23: 20 mL

## 2020-05-23 MED ORDER — TETANUS-DIPHTH-ACELL PERTUSSIS 5-2.5-18.5 LF-MCG/0.5 IM SUSP
0.5000 mL | Freq: Once | INTRAMUSCULAR | Status: DC
  Filled 2020-05-23: qty 0.5

## 2020-05-23 NOTE — ED Notes (Signed)
Patient verbalizes understanding of discharge instructions. Opportunity for questioning and answers were provided. Armband removed by staff, pt discharged from ED via wheelchair to lobby to return home with family.  

## 2020-05-23 NOTE — ED Provider Notes (Signed)
[facility]ERGENC[facility] DEPARTMENT Provider Note  CSN: [id number] Arrival date & time: 05/23/20 1103    History Chief Complaint  Patient presents with  . Fall  . Head Injury    HPI  Michael Hobbs is a 44 y.o. 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 EMS report she was post-ictal on thair arrival. He admits to EtOH use including some this morning before work. He last attempted to quit in March 2020 but left the rehab facility due to Covid restrictions and was only sober for about 2 months afterwards. He is complaining of moderate aching pain to L eyebrow where he sustained a laceration in his fall.    Past Medical History:  Diagnosis Date  . Alcohol abuse    fell in 2006 and had "brain bleed"  . Seizures ([place])     Past Surgical History:  Procedure Laterality Date  . HERNIA REPAIR    . right shoulder surgery.      Family History  Problem Relation Age of Onset  . Stroke Father     Social History   Tobacco Use  . Smoking status: Current Every Day Smoker    Packs/day: 1.00    Types: Cigars  . Smokeless tobacco: Never Used  Substance Use Topics  . Alcohol use: Yes    Comment: 1 pint of beer, and a 1/4 pint of liquor   . Drug use: No     Home Medications Prior to Admission medications   Medication Sig Start Date End Date Taking? Authorizing Provider  hydrochlorothiazide (HYDRODIURIL) 12.5 MG tablet Take 12.5 mg by mouth daily. 08/23/18   [provider]  ibuprofen (ADVIL,MOTRIN) 800 MG tablet Take 1 tablet (800 mg total) by mouth every 4 (four) hours as needed for fever or mild pain. Patient not taking: Reported on 10/26/2018 10/04/18   Nita Sells, MD  levofloxacin (LEVAQUIN) 500 MG tablet Take 1 tablet (500 mg total) by mouth daily. Patient not taking: Reported on 10/26/2018 10/04/18   Nita Sells, MD  Vitamin D,  Ergocalciferol, (DRISDOL) 1.25 MG (50000 UT) CAPS capsule Take 50,000 Units by mouth once a week. Take 1 capsule by mouth every week for 8 weeks--pt actually takes every 10 days. 08/23/18   [provider]     Allergies    Patient has no known allergies.   Review of Systems   Review of Systems A comprehensive review of systems was completed and negative except as noted in HPI.    Physical Exam BP (!) 163/103   Pulse 98   Temp 100 F (37.8 C) (Oral)   Resp 18   SpO2 96%   Physical Exam Vitals and nursing note reviewed.  Constitutional:      Appearance: Normal appearance.  HENT:     Head: Normocephalic.     Comments: 4cm laceration over L eyebrow    Nose: Nose normal.     Mouth/Throat:     Mouth: Mucous membranes are moist.  Eyes:     Extraocular Movements: Extraocular movements intact.     Conjunctiva/sclera: Conjunctivae normal.  Cardiovascular:     Rate and Rhythm: Normal rate.  Pulmonary:     Effort: Pulmonary effort is normal.     Breath sounds: Normal breath sounds.     Comments: Small Abrasion/hematoma over L upper chest wall Abdominal:  General: Abdomen is flat.     Palpations: Abdomen is soft.     Tenderness: There is no abdominal tenderness.  Musculoskeletal:        General: No swelling. Normal range of motion.     Cervical back: Neck supple.  Skin:    General: Skin is warm and dry.  Neurological:     General: No focal deficit present.     Mental Status: He is alert.  Psychiatric:        Mood and Affect: Mood normal.      ED Results / Procedures / Treatments   Labs (all labs ordered are listed, but only abnormal results are displayed) Labs Reviewed  COMPREHENSIVE METABOLIC PANEL - Abnormal; Notable for the following components:      Result Value   Sodium 134 (*)    Chloride 95 (*)    Glucose, Bld 132 (*)    BUN <5 (*)    AST 89 (*)    Total Bilirubin 1.3 (*)    Anion gap 16 (*)    All other components within normal limits  CBC  WITH DIFFERENTIAL/PLATELET - Abnormal; Notable for the following components:   RBC 3.72 (*)    MCV 106.5 (*)    MCH 35.5 (*)    Platelets 57 (*)    All other components within normal limits  ETHANOL  PROTIME-INR    EKG None  Radiology CT Head Wo Contrast  Result Date: 05/23/2020 CLINICAL DATA:  Seizure with fall and laceration above left eye EXAM: CT HEAD WITHOUT CONTRAST CT MAXILLOFACIAL WITHOUT CONTRAST CT CERVICAL SPINE WITHOUT CONTRAST TECHNIQUE: Multidetector CT imaging of the head, cervical spine, and maxillofacial structures were performed using the standard protocol without intravenous contrast. Multiplanar CT image reconstructions of the cervical spine and maxillofacial structures were also generated. COMPARISON:  CT 03/11/2018 FINDINGS: CT HEAD FINDINGS Brain: No evidence of acute infarction, hemorrhage, hydrocephalus, extra-axial collection or mass lesion/mass effect. Vascular: Atherosclerotic calcifications involving the large vessels of the skull base. No unexpected hyperdense vessel. Skull: Negative for acute calvarial fracture. Other: None. CT MAXILLOFACIAL FINDINGS Osseous: No acute maxillofacial bone fracture. Chronic posttraumatic deformity of the right nasal bone. Bony orbital walls intact. Intact zygomatic arches. The mandible intact. Temporomandibular joints aligned without dislocation. Orbits: Negative. No postseptal traumatic or inflammatory finding. Sinuses: Mild mucosal thickening within the bilateral maxillary sinuses. AS remaining paranasal sinuses are clear. No air-fluid level. Mastoid air cells are clear. Soft tissues: Prominent left periorbital soft tissue swelling with skin irregularity compatible with reported laceration. No radiopaque soft tissue foreign body. CT CERVICAL SPINE FINDINGS Alignment: Facet joints are aligned without dislocation or traumatic listhesis. Dens and lateral masses are aligned. Chronic reversal of the cervical lordosis, similar to prior. Skull  base and vertebrae: No acute fracture. No primary bone lesion or focal pathologic process. Soft tissues and spinal canal: No prevertebral fluid or swelling. No visible canal hematoma. Disc levels: Disc heights are relatively preserved. There is multilevel uncovertebral spurring. Facet arthropathy is most pronounced on the right at C2-3. Suggestion of at least mild canal stenosis at C4-5 and C5-6. No appreciable interval progression compared to the previous study. Upper chest: Mild emphysematous changes within the visualized lung apices. Other: Aortic and bilateral carotid atherosclerosis. IMPRESSION: 1. No acute intracranial findings. 2. No acute maxillofacial bone fracture. 3. Left periorbital soft tissue swelling with skin irregularity compatible with reported laceration. 4. No acute fracture or traumatic listhesis of the cervical spine. 5. Degenerative changes with chronic reversal  of the cervical lordosis. Aortic Atherosclerosis (ICD10-I70.0) and Emphysema (ICD10-J43.9). Electronically Signed   By: Davina Poke D.O.   On: 05/23/2020 12:46   CT Cervical Spine Wo Contrast  Result Date: 05/23/2020 CLINICAL DATA:  Seizure with fall and laceration above left eye EXAM: CT HEAD WITHOUT CONTRAST CT MAXILLOFACIAL WITHOUT CONTRAST CT CERVICAL SPINE WITHOUT CONTRAST TECHNIQUE: Multidetector CT imaging of the head, cervical spine, and maxillofacial structures were performed using the standard protocol without intravenous contrast. Multiplanar CT image reconstructions of the cervical spine and maxillofacial structures were also generated. COMPARISON:  CT 03/11/2018 FINDINGS: CT HEAD FINDINGS Brain: No evidence of acute infarction, hemorrhage, hydrocephalus, extra-axial collection or mass lesion/mass effect. Vascular: Atherosclerotic calcifications involving the large vessels of the skull base. No unexpected hyperdense vessel. Skull: Negative for acute calvarial fracture. Other: None. CT MAXILLOFACIAL FINDINGS  Osseous: No acute maxillofacial bone fracture. Chronic posttraumatic deformity of the right nasal bone. Bony orbital walls intact. Intact zygomatic arches. The mandible intact. Temporomandibular joints aligned without dislocation. Orbits: Negative. No postseptal traumatic or inflammatory finding. Sinuses: Mild mucosal thickening within the bilateral maxillary sinuses. AS remaining paranasal sinuses are clear. No air-fluid level. Mastoid air cells are clear. Soft tissues: Prominent left periorbital soft tissue swelling with skin irregularity compatible with reported laceration. No radiopaque soft tissue foreign body. CT CERVICAL SPINE FINDINGS Alignment: Facet joints are aligned without dislocation or traumatic listhesis. Dens and lateral masses are aligned. Chronic reversal of the cervical lordosis, similar to prior. Skull base and vertebrae: No acute fracture. No primary bone lesion or focal pathologic process. Soft tissues and spinal canal: No prevertebral fluid or swelling. No visible canal hematoma. Disc levels: Disc heights are relatively preserved. There is multilevel uncovertebral spurring. Facet arthropathy is most pronounced on the right at C2-3. Suggestion of at least mild canal stenosis at C4-5 and C5-6. No appreciable interval progression compared to the previous study. Upper chest: Mild emphysematous changes within the visualized lung apices. Other: Aortic and bilateral carotid atherosclerosis. IMPRESSION: 1. No acute intracranial findings. 2. No acute maxillofacial bone fracture. 3. Left periorbital soft tissue swelling with skin irregularity compatible with reported laceration. 4. No acute fracture or traumatic listhesis of the cervical spine. 5. Degenerative changes with chronic reversal of the cervical lordosis. Aortic Atherosclerosis (ICD10-I70.0) and Emphysema (ICD10-J43.9). Electronically Signed   By: Davina Poke D.O.   On: 05/23/2020 12:46   CT Maxillofacial WO CM  Result Date:  05/23/2020 CLINICAL DATA:  Seizure with fall and laceration above left eye EXAM: CT HEAD WITHOUT CONTRAST CT MAXILLOFACIAL WITHOUT CONTRAST CT CERVICAL SPINE WITHOUT CONTRAST TECHNIQUE: Multidetector CT imaging of the head, cervical spine, and maxillofacial structures were performed using the standard protocol without intravenous contrast. Multiplanar CT image reconstructions of the cervical spine and maxillofacial structures were also generated. COMPARISON:  CT 03/11/2018 FINDINGS: CT HEAD FINDINGS Brain: No evidence of acute infarction, hemorrhage, hydrocephalus, extra-axial collection or mass lesion/mass effect. Vascular: Atherosclerotic calcifications involving the large vessels of the skull base. No unexpected hyperdense vessel. Skull: Negative for acute calvarial fracture. Other: None. CT MAXILLOFACIAL FINDINGS Osseous: No acute maxillofacial bone fracture. Chronic posttraumatic deformity of the right nasal bone. Bony orbital walls intact. Intact zygomatic arches. The mandible intact. Temporomandibular joints aligned without dislocation. Orbits: Negative. No postseptal traumatic or inflammatory finding. Sinuses: Mild mucosal thickening within the bilateral maxillary sinuses. AS remaining paranasal sinuses are clear. No air-fluid level. Mastoid air cells are clear. Soft tissues: Prominent left periorbital soft tissue swelling with skin irregularity compatible with reported  laceration. No radiopaque soft tissue foreign body. CT CERVICAL SPINE FINDINGS Alignment: Facet joints are aligned without dislocation or traumatic listhesis. Dens and lateral masses are aligned. Chronic reversal of the cervical lordosis, similar to prior. Skull base and vertebrae: No acute fracture. No primary bone lesion or focal pathologic process. Soft tissues and spinal canal: No prevertebral fluid or swelling. No visible canal hematoma. Disc levels: Disc heights are relatively preserved. There is multilevel uncovertebral spurring. Facet  arthropathy is most pronounced on the right at C2-3. Suggestion of at least mild canal stenosis at C4-5 and C5-6. No appreciable interval progression compared to the previous study. Upper chest: Mild emphysematous changes within the visualized lung apices. Other: Aortic and bilateral carotid atherosclerosis. IMPRESSION: 1. No acute intracranial findings. 2. No acute maxillofacial bone fracture. 3. Left periorbital soft tissue swelling with skin irregularity compatible with reported laceration. 4. No acute fracture or traumatic listhesis of the cervical spine. 5. Degenerative changes with chronic reversal of the cervical lordosis. Aortic Atherosclerosis (ICD10-I70.0) and Emphysema (ICD10-J43.9). Electronically Signed   By: Davina Poke D.O.   On: 05/23/2020 12:46    Procedures Procedures  Medications Ordered in the ED Medications  Tdap (BOOSTRIX) injection 0.5 mL (0.5 mLs Intramuscular Refused 05/23/20 1207)  lidocaine-EPINEPHrine (XYLOCAINE W/EPI) 1 %-1:100000 (with pres) injection 20 mL (20 mLs Infiltration Given by Other 05/23/20 1254)     MDM Rules/Calculators/A&P MDM Patient back to baseline now. Admits to EtOH use this morning, now with seizure activity. Will check labs and imaging. Will need wound closure. ED Course  I have reviewed the triage vital signs and the nursing notes.  Pertinent labs & imaging results that were available during my care of the patient were reviewed by me and considered in my medical decision making (see chart for details).  Clinical Course as of May 24 1355  Tue May 23, 2020  1215 CBC unremarkable   [CS]  1227 Alcohol is neg.    [CS]  0388 CMP is unremarkable.   [CS]  1250 CT neg for ICH or other traumatic injury, no facial bone fractures.    [CS]  8280 Patient awake and alert. Wound repaired by PA student under supervision of [place], [place]. There is a larger hematoma now post-repair with some oozing of blood from the wound. I am concerned that there  is a venous bleeder underneath that may still be oozing. I offered to take down his stitches, irrigate his wound and explore to identify a bleeding vessel but he declines, states he just wants to go home. Given standard wound care advise, advised that he may continue to ooze from his wound and to return to the ED if it does not stop. He would like resource guide for substance abuse treatment. Advised to stop drinking, no driving until seizure free for 6 months. PCP follow up.    [CS]    Clinical Course User Index [CS] Truddie Hidden, MD    Final Clinical Impression(s) / ED Diagnoses Final diagnoses:  Laceration of forehead, initial encounter  Traumatic hematoma of forehead, initial encounter  Seizure [facility])    Rx / DC Orders ED Discharge Orders    None       Truddie Hidden, MD 05/23/20 1356

## 2020-05-23 NOTE — ED Triage Notes (Signed)
BIB by [facility] after pt had fallen and had a seizure at work. Acccording to EMS, PT was post-ictal on scene with confusion and incontinence. Pt has  2 inch lac above left eye. PT has hx of seizures and does not take meds. Pt reports his last seizure was two years ago.   B/P 149/102 Temp 100.0 temp oral 97% RA 13 RR 89 HR

## 2020-05-23 NOTE — ED Provider Notes (Addendum)
..  Laceration Repair  Date/Time: 05/23/2020 1:51 PM Performed by: Franchot Heidelberg, PA-C Authorized by: Franchot Heidelberg, PA-C   Consent:    Consent obtained:  Verbal   Consent given by:  Patient   Risks discussed:  Infection, pain, retained foreign body, tendon damage, vascular damage, poor cosmetic result, poor wound healing, need for additional repair and nerve damage Anesthesia (see MAR for exact dosages):    Anesthesia method:  Local infiltration   Local anesthetic:  Lidocaine 1% w/o epi Laceration details:    Location:  Face   Face location:  L eyebrow   Length (cm):  4   Depth (mm):  5 Repair type:    Repair type:  Simple Pre-procedure details:    Preparation:  Patient was prepped and draped in usual sterile fashion and imaging obtained to evaluate for foreign bodies Exploration:    Hemostasis achieved with:  Direct pressure   Wound exploration: wound explored through full range of motion and entire depth of wound probed and visualized     Wound extent: no foreign bodies/material noted and no underlying fracture noted   Treatment:    Area cleansed with:  Saline Skin repair:    Repair method:  Sutures   Suture size:  5-0   Suture material:  Nylon   Suture technique:  Simple interrupted   Number of sutures:  8 Approximation:    Approximation:  Close Post-procedure details:    Dressing:  Sterile dressing   Patient tolerance of procedure:  Tolerated well, no immediate complications Comments:     Lac repaired by PA student B McCoy. Pt tolerated well. Large hematoma noted after procedure.           Franchot Heidelberg, PA-C 05/23/20 1354    Truddie Hidden, MD 05/23/20 1452

## 2020-06-02 ENCOUNTER — Emergency Department (HOSPITAL_COMMUNITY)
Admission: EM | Admit: 2020-06-02 | Discharge: 2020-06-02 | Disposition: A | Discharge status: Home / Self Care | Payer: Self-pay | Attending: Emergency Medicine | Admitting: Emergency Medicine

## 2020-06-02 ENCOUNTER — Encounter (HOSPITAL_COMMUNITY): Payer: Self-pay | Admitting: Emergency Medicine

## 2020-06-02 DIAGNOSIS — R220 Localized swelling, mass and lump, head: Secondary | ICD-10-CM | POA: Insufficient documentation

## 2020-06-02 DIAGNOSIS — Z760 Encounter for issue of repeat prescription: Secondary | ICD-10-CM | POA: Insufficient documentation

## 2020-06-02 DIAGNOSIS — Z4802 Encounter for removal of sutures: Secondary | ICD-10-CM

## 2020-06-02 DIAGNOSIS — F17290 Nicotine dependence, other tobacco product, uncomplicated: Secondary | ICD-10-CM | POA: Insufficient documentation

## 2020-06-02 MED ORDER — ACETAMINOPHEN 500 MG PO TABS: 1000.0000 mg | ORAL_TABLET | Freq: Once | ORAL | Status: DC

## 2020-06-02 NOTE — ED Triage Notes (Signed)
Pt reports here for suture removal at L eyebrow, had stitches placed on 10/5. Continued swelling at site of injury.

## 2020-06-02 NOTE — Discharge Instructions (Addendum)
I removed a large amount of scab and dried blood from the wound and removed all stitches, there is a small area in the center of the laceration that is still open, a Steri-Strip was placed over this, this should be left in place and will fall off on its own.  Keep the area dry for the next 24 hours and then clean regularly with saline or soap and water.  You can alternate using ice and warm compresses to help with surrounding swelling and underlying hematoma.  You can use ibuprofen and Tylenol for pain.

## 2020-06-02 NOTE — ED Provider Notes (Signed)
[facility] Provider Note   CSN: [id number] Arrival date & time: 06/02/20  4128     History Chief Complaint  Patient presents with  . Suture / Staple Removal    [facility] is a 44 y.o. male.  [facility] is a 44 y.o. male with a history of seizures, and alcohol abuse, who presents for suture removal, he was seen in the ED on 10/5 after he had a witnessed fall and seizure, and sustained a laceration to the left eyebrow, 8 sutures were placed, patient states he has continued to have some pain and swelling over this area but no redness or drainage.  Patient has underlying hematoma.  Reviewed notes from previous evaluation, patient had reassuring imaging at this time.  He states he has been keeping the area clean with saline, and has been applying ice but has not done anything else to treat pain and swelling.  No other aggravating or alleviating factors.        Past Medical History:  Diagnosis Date  . Alcohol abuse    fell in 2006 and had "brain bleed"  . Seizures [facility])     Patient Active Problem List   Diagnosis Date Noted  . CAP (community acquired pneumonia) 10/02/2018  . PNA (pneumonia) 10/02/2018  . Syncope 12/01/2012  . Subarachnoid hematoma ([place]) 12/01/2012  . Nasal fracture 12/01/2012  . Periorbital contusion of left eye 12/01/2012  . Alcoholism ([place]) 12/01/2012    Past Surgical History:  Procedure Laterality Date  . HERNIA REPAIR    . right shoulder surgery.         Family History  Problem Relation Age of Onset  . Stroke Father     Social History   Tobacco Use  . Smoking status: Current Every Day Smoker    Packs/day: 1.00    Types: Cigars  . Smokeless tobacco: Never Used  Substance Use Topics  . Alcohol use: Yes    Comment: 1 pint of beer, and a 1/4 pint of liquor   . Drug use: No    Home Medications Prior to Admission medications   Medication Sig Start Date End Date Taking? Authorizing Provider   hydrochlorothiazide (HYDRODIURIL) 12.5 MG tablet Take 12.5 mg by mouth daily. 08/23/18   [provider]  ibuprofen (ADVIL,MOTRIN) 800 MG tablet Take 1 tablet (800 mg total) by mouth every 4 (four) hours as needed for fever or mild pain. Patient not taking: Reported on 10/26/2018 10/04/18   Nita Sells, MD  levofloxacin (LEVAQUIN) 500 MG tablet Take 1 tablet (500 mg total) by mouth daily. Patient not taking: Reported on 10/26/2018 10/04/18   Nita Sells, MD  Vitamin D, Ergocalciferol, (DRISDOL) 1.25 MG (50000 UT) CAPS capsule Take 50,000 Units by mouth once a week. Take 1 capsule by mouth every week for 8 weeks--pt actually takes every 10 days. 08/23/18   [provider]    Allergies    Patient has no known allergies.  Review of Systems   Review of Systems  Constitutional: Negative for chills and fever.  HENT: Positive for facial swelling.   Skin: Positive for wound.    Physical Exam Updated Vital Signs BP (!) 149/98   Pulse (!) 109   Temp 98 F (36.7 C) (Oral)   Resp 16   Ht 5\' 11"  (1.803 m)   Wt 77.1 kg   SpO2 99%   BMI 23.71 kg/m   Physical Exam Vitals and nursing note reviewed.  Constitutional:  General: He is not in acute distress.    Appearance: Normal appearance. He is well-developed. He is not ill-appearing or diaphoretic.  HENT:     Head: Normocephalic.     Comments: Swelling over the left forehead and eyebrow, there is a large amount of dried blood and scab over laceration through the left eyebrow, but able to identify some suture strings present throughout the wound, underlying swelling and palpable hematoma remains Eyes:     General:        Right eye: No discharge.        Left eye: No discharge.     Pupils: Pupils are equal, round, and reactive to light.     Comments: Significant swelling noted around the left eye with bilateral subconjunctival hemorrhages, no evidence of hyphema, eyebrow laceration as noted above  Pulmonary:      Effort: Pulmonary effort is normal. No respiratory distress.  Musculoskeletal:     Cervical back: Neck supple.  Skin:    General: Skin is warm and dry.  Neurological:     Mental Status: He is alert and oriented to person, place, and time.     Coordination: Coordination normal.     Comments: Speech is clear, able to follow commands Moves extremities without ataxia, coordination intact  Psychiatric:        Mood and Affect: Mood normal.        Behavior: Behavior normal.     ED Results / Procedures / Treatments   Labs (all labs ordered are listed, but only abnormal results are displayed) Labs Reviewed - No data to display  EKG None  Radiology No results found.  Procedures .Suture Removal  Date/Time: 06/02/2020 11:15 AM Performed by: Jacqlyn Larsen, PA-C Authorized by: Jacqlyn Larsen, PA-C   Consent:    Consent obtained:  Verbal   Consent given by:  Patient   Risks discussed:  Bleeding, pain and wound separation   Alternatives discussed:  No treatment Location:    Location:  Head/neck   Head/neck location:  Eyebrow   Eyebrow location:  L eyebrow Procedure details:    Wound appearance:  Tender (Swelling with underlying hematoma)   Number of sutures removed:  8 Post-procedure details:    Post-removal:  No dressing applied (Patient left prior to dressing being applied)   Patient tolerance of procedure:  Tolerated well, no immediate complications Comments:     Laceration to the left eyebrow with sutures placed 10 days ago, there is a large amount of dried blood and scabbing, and it is difficult to visualize most of the sutures, using Shur-Clens wash and gauze, as well as forceps debrided away some of the dried blood and scab and was able to remove all 8 sutures.  There is a small area of opening in the center of the wound.  Had plan to apply a Steri-Strip and dressing but patient left from the department prior to receiving dressing.   (including critical care  time)  Medications Ordered in ED Medications  acetaminophen (TYLENOL) tablet 1,000 mg (has no administration in time range)    ED Course  I have reviewed the triage vital signs and the nursing notes.  Pertinent labs & imaging results that were available during my care of the patient were reviewed by me and considered in my medical decision making (see chart for details).    MDM Rules/Calculators/A&P  44 year old male presents for suture removal, sutures were placed on 10/5 after patient sustained a left eyebrow laceration when he had a seizure at work.  He states that he has been cleaning the area with saline, has still noted some pain and swelling over the area.  Patient had imaging during initial evaluation with no intracranial injury or maxillofacial fractures.  Patient likely has continue underlying hematoma.  There is a large amount of dried blood and scabbing over laceration that made it difficult to visualize sutures, this area was debrided of most of the dried blood and then was able to remove all 8 sutures that were placed, there is a small area in the center of the wound that is still open, but there are no signs of infection, no purulent drainage.  Attempted to have nursing apply Steri-Strip and dressing but patient left prior to receiving dressing or discharge paperwork.  Final Clinical Impression(s) / ED Diagnoses Final diagnoses:  Visit for suture removal    Rx / DC Orders ED Discharge Orders    None       Janet Berlin 06/02/20 1122    Maudie Flakes, MD 06/05/20 1939

## 2020-06-09 ENCOUNTER — Encounter (HOSPITAL_COMMUNITY): Payer: Self-pay | Admitting: Emergency Medicine

## 2020-06-09 ENCOUNTER — Other Ambulatory Visit: Payer: Self-pay

## 2020-06-09 ENCOUNTER — Emergency Department (HOSPITAL_COMMUNITY)
Admission: EM | Admit: 2020-06-09 | Discharge: 2020-06-09 | Disposition: A | Discharge status: Home / Self Care | Payer: Self-pay | Attending: Emergency Medicine | Admitting: Emergency Medicine

## 2020-06-09 DIAGNOSIS — X58XXXD Exposure to other specified factors, subsequent encounter: Secondary | ICD-10-CM | POA: Insufficient documentation

## 2020-06-09 DIAGNOSIS — Z4802 Encounter for removal of sutures: Secondary | ICD-10-CM | POA: Insufficient documentation

## 2020-06-09 DIAGNOSIS — S01112D Laceration without foreign body of left eyelid and periocular area, subsequent encounter: Secondary | ICD-10-CM | POA: Insufficient documentation

## 2020-06-09 DIAGNOSIS — F17290 Nicotine dependence, other tobacco product, uncomplicated: Secondary | ICD-10-CM | POA: Insufficient documentation

## 2020-06-09 NOTE — ED Notes (Signed)
This nurse attempted to review discharge summary with pt, pt uninterested, walked away from this nurse, argumentative. Ambulatory off unit. No s/s of acute distress noted.

## 2020-06-09 NOTE — ED Triage Notes (Signed)
Pt states he is here to get sutures removed. Pt also states that his left eye is still swollen. Denies blurred vision.

## 2020-06-09 NOTE — ED Provider Notes (Signed)
[facility] EMERGENCY DEPARTMENT Provider Note   CSN: [id number] Arrival date & time: 06/09/20  1258     History Chief Complaint  Patient presents with  . Suture / Staple Removal  . Wound Check    [facility] is a 44 y.o. male.  Patient presents to emergency department for reevaluation of left eyebrow wound.  Patient was seen a week ago and had sutures removed.  Patient states that there are more sutures in the wound.  He also is concerned about continued swelling, particularly of his left upper eyelid.  No vision changes.  Patient does not have a primary care doctor.  CT imaging on initial visit where sutures were placed, was negative for fracture.  CT head, maxillofacial, cervical spine were performed.        Past Medical History:  Diagnosis Date  . Alcohol abuse    fell in 2006 and had "brain bleed"  . Seizures [facility])     Patient Active Problem List   Diagnosis Date Noted  . CAP (community acquired pneumonia) 10/02/2018  . PNA (pneumonia) 10/02/2018  . Syncope 12/01/2012  . Subarachnoid hematoma ([place]) 12/01/2012  . Nasal fracture 12/01/2012  . Periorbital contusion of left eye 12/01/2012  . Alcoholism ([place]) 12/01/2012    Past Surgical History:  Procedure Laterality Date  . HERNIA REPAIR    . right shoulder surgery.         Family History  Problem Relation Age of Onset  . Stroke Father     Social History   Tobacco Use  . Smoking status: Current Every Day Smoker    Packs/day: 1.00    Types: Cigars  . Smokeless tobacco: Never Used  Substance Use Topics  . Alcohol use: Yes    Comment: 1 pint of beer, and a 1/4 pint of liquor   . Drug use: No    Home Medications Prior to Admission medications   Medication Sig Start Date End Date Taking? Authorizing Provider  hydrochlorothiazide (HYDRODIURIL) 12.5 MG tablet Take 12.5 mg by mouth daily. 08/23/18   [provider]  Vitamin D, Ergocalciferol, (DRISDOL) 1.25 MG (50000 UT)  CAPS capsule Take 50,000 Units by mouth once a week. Take 1 capsule by mouth every week for 8 weeks--pt actually takes every 10 days. 08/23/18   [provider]    Allergies    Patient has no known allergies.  Review of Systems   Review of Systems  HENT: Positive for facial swelling.   Skin: Positive for wound.    Physical Exam Updated Vital Signs BP (!) 140/93   Pulse (!) 101   Temp 98.1 F (36.7 C) (Oral)   Resp 16   Ht 6' (1.829 m)   Wt 79.4 kg   SpO2 99%   BMI 23.73 kg/m   Physical Exam Vitals and nursing note reviewed.  Constitutional:      Appearance: He is well-developed.  HENT:     Head: Normocephalic.     Comments: Healing left eyebrow laceration, scabbing centrally, 1 visible Ethilon suture.  No drainage.  No surrounding erythema.  Swelling noted just inferior to the eyebrow over the right superior orbital rim. Eyes:     Conjunctiva/sclera: Conjunctivae normal.     Comments: Mild subconjunctival hematoma.   Pulmonary:     Effort: No respiratory distress.  Musculoskeletal:     Cervical back: Normal range of motion and neck supple.  Skin:    General: Skin is warm and dry.  Neurological:     Mental Status: He is alert.     ED Results / Procedures / Treatments   Labs (all labs ordered are listed, but only abnormal results are displayed) Labs Reviewed - No data to display  EKG None  Radiology No results found.  Procedures .Suture Removal  Date/Time: 06/09/2020 2:10 PM Performed by: Carlisle Cater, PA-C Authorized by: Carlisle Cater, PA-C   Consent:    Consent obtained:  Verbal   Consent given by:  Patient   Risks discussed:  Pain and bleeding   Alternatives discussed:  No treatment Location:    Location:  Head/neck   Head/neck location:  Eyebrow   Eyebrow location:  L eyebrow Procedure details:    Wound appearance:  No signs of infection and nonpurulent   Number of sutures removed:  1 Post-procedure details:    Post-removal:  No  dressing applied   Patient tolerance of procedure:  Tolerated well, no immediate complications   (including critical care time)  Medications Ordered in ED Medications - No data to display  ED Course  I have reviewed the triage vital signs and the nursing notes.  Pertinent labs & imaging results that were available during my care of the patient were reviewed by me and considered in my medical decision making (see chart for details).  Patient seen and examined.  Patient counseled on wound healing.  1 suture removed as above.  I discussed benefit of establishing care with PCP, especially regarding seizure history.  Patient also requesting "AA resources".  Shortly after discussion, patient eloped from the ED, prior to receiving referrals and discharge paperwork.  Vital signs reviewed and are as follows: BP (!) 140/93   Pulse (!) 101   Temp 98.1 F (36.7 C) (Oral)   Resp 16   Ht 6' (1.829 m)   Wt 79.4 kg   SpO2 99%   BMI 23.73 kg/m     MDM Rules/Calculators/A&P                          Patient with additional suture removed from healing wound.  Wound healing appears appropriate without signs of infection.   Final Clinical Impression(s) / ED Diagnoses Final diagnoses:  Visit for suture removal    Rx / DC Orders ED Discharge Orders    None       Carlisle Cater, PA-C 06/09/20 1417    Wyvonnia Dusky, MD 06/09/20 1901

## 2020-06-15 ENCOUNTER — Other Ambulatory Visit: Payer: Self-pay

## 2020-06-15 ENCOUNTER — Emergency Department (HOSPITAL_COMMUNITY): Payer: Self-pay

## 2020-06-15 ENCOUNTER — Emergency Department (HOSPITAL_COMMUNITY)
Admission: EM | Admit: 2020-06-15 | Discharge: 2020-06-15 | Disposition: A | Discharge status: Home / Self Care | Payer: Self-pay | Attending: Emergency Medicine | Admitting: Emergency Medicine

## 2020-06-15 ENCOUNTER — Encounter (HOSPITAL_COMMUNITY): Payer: Self-pay | Admitting: Emergency Medicine

## 2020-06-15 DIAGNOSIS — F1010 Alcohol abuse, uncomplicated: Secondary | ICD-10-CM | POA: Insufficient documentation

## 2020-06-15 DIAGNOSIS — R519 Headache, unspecified: Secondary | ICD-10-CM | POA: Insufficient documentation

## 2020-06-15 DIAGNOSIS — R600 Localized edema: Secondary | ICD-10-CM | POA: Insufficient documentation

## 2020-06-15 DIAGNOSIS — F17290 Nicotine dependence, other tobacco product, uncomplicated: Secondary | ICD-10-CM | POA: Insufficient documentation

## 2020-06-15 DIAGNOSIS — G44309 Post-traumatic headache, unspecified, not intractable: Secondary | ICD-10-CM

## 2020-06-15 MED ORDER — BUTALBITAL-APAP-CAFFEINE 50-325-40 MG PO TABS: 1.0000 | ORAL_TABLET | Freq: Four times a day (QID) | ORAL | 0 refills | Status: DC | PRN

## 2020-06-15 NOTE — ED Provider Notes (Signed)
[place] EMERGENCY DEPARTMENT Provider Note   CSN: [id number] Arrival date & time: 06/15/20  1234     History Chief Complaint  Patient presents with  . Headache    [facility] is a 44 y.o. male.  The history is provided by the patient and medical records. No language interpreter was used.  Headache    45 year old male prior history of subarachnoid hematoma, alcohol abuse, alcohol induced seizures with prior neg EEG, presenting complaint of headache.  Patient had an episode of what suspect to be alcohol induced seizure on 05/23/2020 who was seen in the ED for evaluation.  He did have CT scan of his head, cervical spine as well as maxillofacial without acute finding.  And laceration to his eyebrow that was sutured.  He is here today complaining of a persistent left-sided headache, and swelling to his left eyebrow since the injury.  Headaches described as a 10 out of 10 sharp throbbing's sensitive sensation to his left scalp nothing makes it better or worse.  No associated fever chills no change in vision no nausea vomiting confusion or neck tenderness.  Swelling to his laceration area has been persistent and minimally improving but no complaints of restriction of eye movement or loss of vision.  He admits to continue to drink alcohol regularly and requesting for help with detox.  He has been taking Motrin at home for his symptoms with some improvement.  He report intermittent tingling sensation to his left arm however this has been an ongoing issue even before his fall.  He is ambidextrous.  Past Medical History:  Diagnosis Date  . Alcohol abuse    fell in 2006 and had "brain bleed"  . Seizures [facility])     Patient Active Problem List   Diagnosis Date Noted  . CAP (community acquired pneumonia) 10/02/2018  . PNA (pneumonia) 10/02/2018  . Syncope 12/01/2012  . Subarachnoid hematoma ([place]) 12/01/2012  . Nasal fracture 12/01/2012  . Periorbital contusion of left eye  12/01/2012  . Alcoholism ([place]) 12/01/2012    Past Surgical History:  Procedure Laterality Date  . HERNIA REPAIR    . right shoulder surgery.         Family History  Problem Relation Age of Onset  . Stroke Father     Social History   Tobacco Use  . Smoking status: Current Every Day Smoker    Packs/day: 1.00    Types: Cigars  . Smokeless tobacco: Never Used  Substance Use Topics  . Alcohol use: Yes    Comment: 1 pint of beer, and a 1/4 pint of liquor   . Drug use: No    Home Medications Prior to Admission medications   Medication Sig Start Date End Date Taking? Authorizing Provider  hydrochlorothiazide (HYDRODIURIL) 12.5 MG tablet Take 12.5 mg by mouth daily. 08/23/18   [provider]  Vitamin D, Ergocalciferol, (DRISDOL) 1.25 MG (50000 UT) CAPS capsule Take 50,000 Units by mouth once a week. Take 1 capsule by mouth every week for 8 weeks--pt actually takes every 10 days. 08/23/18   [provider]    Allergies    Patient has no known allergies.  Review of Systems   Review of Systems  Neurological: Positive for headaches.  All other systems reviewed and are negative.   Physical Exam Updated Vital Signs BP (!) 140/100   Pulse 98   Temp 98.5 F (36.9 C) (Oral)   Resp 20   Ht 6' (1.829 m) Comment:  Simultaneous filing. User may not have seen previous data.  Wt 79.4 kg Comment: Simultaneous filing. User may not have seen previous data.  SpO2 98%   BMI 23.73 kg/m   Physical Exam Vitals and nursing note reviewed.  Constitutional:      General: He is not in acute distress.    Appearance: He is well-developed.  HENT:     Head: Normocephalic.     Comments: No scalp tenderness on palpation.  Edema noted to left eyelash at the sites of the previous laceration repair but no evidence of erythema or warmth.  No signs of infection. Eyes:     General: No scleral icterus.    Extraocular Movements: Extraocular movements intact.     Right eye: Normal  extraocular motion and no nystagmus.     Left eye: Normal extraocular motion and no nystagmus.     Conjunctiva/sclera: Conjunctivae normal.     Pupils: Pupils are equal, round, and reactive to light. Pupils are equal.     Right eye: Pupil is round and reactive.     Left eye: Pupil is round and reactive.  Neck:     Comments: No cervical midline spine tenderness. Musculoskeletal:     Cervical back: Normal range of motion and neck supple.     Comments: 5 out of 5 strength of 4 extremities.  Skin:    Findings: No rash.  Neurological:     Mental Status: He is alert and oriented to person, place, and time.     GCS: GCS eye subscore is 4. GCS verbal subscore is 5. GCS motor subscore is 6.     Cranial Nerves: No cranial nerve deficit or dysarthria.  Psychiatric:        Mood and Affect: Mood normal.     ED Results / Procedures / Treatments   Labs (all labs ordered are listed, but only abnormal results are displayed) Labs Reviewed - No data to display  EKG None  Radiology CT Head Wo Contrast  Result Date: 06/15/2020 CLINICAL DATA:  Headache EXAM: CT HEAD WITHOUT CONTRAST TECHNIQUE: Contiguous axial images were obtained from the base of the skull through the vertex without intravenous contrast. COMPARISON:  CT head dated 05/23/2020 FINDINGS: Brain: No evidence of acute infarction, hemorrhage, hydrocephalus, extra-axial collection or mass lesion/mass effect. Vascular: There are vascular calcifications in the carotid siphons. Skull: Normal. Negative for fracture or focal lesion. Sinuses/Orbits: No acute finding. Other: Soft tissue swelling overlying the left periorbital region is redemonstrated, slightly decreased from prior exam. IMPRESSION: No acute intracranial process. Electronically Signed   By: Zerita Boers M.D.   On: 06/15/2020 17:15    Procedures Procedures (including critical care time)  Medications Ordered in ED Medications - No data to display  ED Course  I have reviewed  the triage vital signs and the nursing notes.  Pertinent labs & imaging results that were available during my care of the patient were reviewed by me and considered in my medical decision making (see chart for details).    MDM Rules/Calculators/A&P                          BP (!) 140/100   Pulse 98   Temp 98.5 F (36.9 C) (Oral)   Resp 20   Ht 6' (1.829 m) Comment: Simultaneous filing. User may not have seen previous data.  Wt 79.4 kg Comment: Simultaneous filing. User may not have seen previous data.  SpO2 98%  BMI 23.73 kg/m   Final Clinical Impression(s) / ED Diagnoses Final diagnoses:  Alcohol abuse  Post-concussion headache    Rx / DC Orders ED Discharge Orders         Ordered    butalbital-acetaminophen-caffeine (FIORICET) 50-325-40 MG tablet  Every 6 hours PRN        06/15/20 1801         3:38 PM Patient with significant history of alcohol abuse here with persistent left-sided headache since he fell 3 weeks ago and landed on his head at a standing height.  He had an initial head CT scan as well as maxillofacial CT and cervical spine CT without acute finding.  He had a laceration to his left eyebrow that was sutured.  He complains of persistent swelling at this laceration site.  It appears it may have had like a venous injury causing a hematoma to the affected area.  Anticipate this is in the healing phase I did not see any signs of infection and there was minimal tenderness to palpation therefore have low suspicion for orbital fracture.  There is no restriction of eye movement or visual changes.  Low suspicion for nerve or muscle entrapment.  Given persistent headache, will obtain repeat head CT scan.  Patient also requesting help with alcohol detox.  5:57 PM Fortunately repeat head CT scan unremarkable.  Soft tissue swelling overlying the left periorbital region is still present but slightly decreased from prior exam.  Suspect patient's recurrent headache is likely  due to postconcussive syndrome.  Recommend adequate rest, avoid alcohol use, take over-the-counter medication for discomfort, and I will also provide resource for outpatient alcohol detox.  Patient voiced understanding and agrees to plan.   Domenic Moras, PA-C 06/15/20 1803    Truddie Hidden, MD 06/15/20 [phone]

## 2020-06-15 NOTE — Discharge Instructions (Signed)
Take Fioricet as needed for your headache.  Use resources below to find help with your alcohol abuse.  Return if you have any concern.

## 2020-06-15 NOTE — ED Notes (Signed)
Patient verbalized understanding of AVS and the list of recommended resources for sobriety treatment. Patient with no further questions. DC home w mom who is waiting outside.

## 2020-06-15 NOTE — ED Triage Notes (Signed)
Pt c/o persistent HA for the past 2 weeks since his seizures.

## 2020-07-10 ENCOUNTER — Encounter (HOSPITAL_COMMUNITY): Payer: Self-pay | Admitting: Emergency Medicine

## 2020-07-10 ENCOUNTER — Other Ambulatory Visit: Payer: Self-pay

## 2020-07-10 ENCOUNTER — Emergency Department (HOSPITAL_COMMUNITY)
Admission: EM | Admit: 2020-07-10 | Discharge: 2020-07-10 | Disposition: A | Discharge status: Home / Self Care | Payer: Self-pay | Attending: Emergency Medicine | Admitting: Emergency Medicine

## 2020-07-10 ENCOUNTER — Emergency Department (HOSPITAL_COMMUNITY): Payer: Self-pay

## 2020-07-10 DIAGNOSIS — F1010 Alcohol abuse, uncomplicated: Secondary | ICD-10-CM | POA: Insufficient documentation

## 2020-07-10 DIAGNOSIS — R569 Unspecified convulsions: Secondary | ICD-10-CM | POA: Insufficient documentation

## 2020-07-10 DIAGNOSIS — D696 Thrombocytopenia, unspecified: Secondary | ICD-10-CM | POA: Insufficient documentation

## 2020-07-10 DIAGNOSIS — F17290 Nicotine dependence, other tobacco product, uncomplicated: Secondary | ICD-10-CM | POA: Insufficient documentation

## 2020-07-10 DIAGNOSIS — S0083XA Contusion of other part of head, initial encounter: Secondary | ICD-10-CM | POA: Insufficient documentation

## 2020-07-10 DIAGNOSIS — X58XXXA Exposure to other specified factors, initial encounter: Secondary | ICD-10-CM | POA: Insufficient documentation

## 2020-07-10 LAB — CBC WITH DIFFERENTIAL/PLATELET
Abs Immature Granulocytes: 0.04 10*3/uL (ref 0.00–0.07)
Basophils Absolute: 0 10*3/uL (ref 0.0–0.1)
Basophils Relative: 1 %
Eosinophils Absolute: 0 10*3/uL (ref 0.0–0.5)
Eosinophils Relative: 0 %
HCT: 40.6 % (ref 39.0–52.0)
Hemoglobin: 13.5 g/dL (ref 13.0–17.0)
Immature Granulocytes: 1 %
Lymphocytes Relative: 41 %
Lymphs Abs: 1.8 10*3/uL (ref 0.7–4.0)
MCH: 36.4 pg — ABNORMAL HIGH (ref 26.0–34.0)
MCHC: 33.3 g/dL (ref 30.0–36.0)
MCV: 109.4 fL — ABNORMAL HIGH (ref 80.0–100.0)
Monocytes Absolute: 0.5 10*3/uL (ref 0.1–1.0)
Monocytes Relative: 12 %
Neutro Abs: 2 10*3/uL (ref 1.7–7.7)
Neutrophils Relative %: 45 %
Platelets: 71 10*3/uL — ABNORMAL LOW (ref 150–400)
RBC: 3.71 MIL/uL — ABNORMAL LOW (ref 4.22–5.81)
RDW: 13.2 % (ref 11.5–15.5)
WBC: 4.5 10*3/uL (ref 4.0–10.5)
nRBC: 0 % (ref 0.0–0.2)

## 2020-07-10 LAB — CBG MONITORING, ED: Glucose-Capillary: 118 mg/dL — ABNORMAL HIGH (ref 70–99)

## 2020-07-10 LAB — COMPREHENSIVE METABOLIC PANEL
ALT: 24 U/L (ref 0–44)
AST: 64 U/L — ABNORMAL HIGH (ref 15–41)
Albumin: 4.5 g/dL (ref 3.5–5.0)
Alkaline Phosphatase: 44 U/L (ref 38–126)
Anion gap: 18 — ABNORMAL HIGH (ref 5–15)
BUN: 5 mg/dL — ABNORMAL LOW (ref 6–20)
CO2: 20 mmol/L — ABNORMAL LOW (ref 22–32)
Calcium: 9.2 mg/dL (ref 8.9–10.3)
Chloride: 97 mmol/L — ABNORMAL LOW (ref 98–111)
Creatinine, Ser: 1.14 mg/dL (ref 0.61–1.24)
GFR, Estimated: >60 mL/min (ref >60)
Glucose, Bld: 120 mg/dL — ABNORMAL HIGH (ref 70–99)
Potassium: 3.4 mmol/L — ABNORMAL LOW (ref 3.5–5.1)
Sodium: 135 mmol/L (ref 135–145)
Total Bilirubin: 0.9 mg/dL (ref 0.3–1.2)
Total Protein: 7.7 g/dL (ref 6.5–8.1)

## 2020-07-10 LAB — MAGNESIUM: Magnesium: 1.6 mg/dL — ABNORMAL LOW (ref 1.7–2.4)

## 2020-07-10 LAB — ETHANOL: Alcohol, Ethyl (B): <10 mg/dL (ref <10)

## 2020-07-10 MED ORDER — LEVETIRACETAM 500 MG PO TABS: 500.0000 mg | ORAL_TABLET | Freq: Two times a day (BID) | ORAL | 0 refills | Status: DC

## 2020-07-10 MED ORDER — LIDOCAINE 5 % EX PTCH: 1.0000 | MEDICATED_PATCH | CUTANEOUS | 0 refills | Status: DC

## 2020-07-10 MED ORDER — MAGNESIUM SULFATE 2 GM/50ML IV SOLN
2.0000 g | Freq: Once | INTRAVENOUS | Status: AC
  Administered 2020-07-10: 2 g via INTRAVENOUS
  Filled 2020-07-10: qty 50

## 2020-07-10 MED ORDER — LIDOCAINE-EPINEPHRINE-TETRACAINE (LET) TOPICAL GEL
3.0000 mL | Freq: Once | TOPICAL | Status: AC
  Administered 2020-07-10: 3 mL via TOPICAL
  Filled 2020-07-10: qty 3

## 2020-07-10 MED ORDER — THIAMINE HCL 100 MG/ML IJ SOLN
100.0000 mg | Freq: Once | INTRAMUSCULAR | Status: AC
  Administered 2020-07-10: 100 mg via INTRAVENOUS
  Filled 2020-07-10: qty 2

## 2020-07-10 MED ORDER — LEVETIRACETAM IN NACL 1000 MG/100ML IV SOLN
1000.0000 mg | Freq: Once | INTRAVENOUS | Status: AC
  Administered 2020-07-10: 1000 mg via INTRAVENOUS
  Filled 2020-07-10: qty 100

## 2020-07-10 NOTE — ED Notes (Signed)
Discharge instructions provided to patient. Verbalized understanding. Alert and oriented. IV lock removed. Escorted out of ED via w/c to mother.

## 2020-07-10 NOTE — ED Notes (Signed)
Pt states 10/10 pain in eye area. Past hx of seizure with fall and head lac. Tech provided ice pack.

## 2020-07-10 NOTE — ED Provider Notes (Signed)
[facility] EMERGENCY DEPARTMENT Provider Note   CSN: [id number] Arrival date & time: 07/10/20  1107     History Chief Complaint  Patient presents with  . Headache    QUENTAVIOUS RITTENHOUSE is a 44 y.o. male.  The history is provided by the patient and medical records.  Headache  DEMETRIE BORGE is a 44 y.o. male who presents to the Emergency Department complaining of headache. He presents to the emergency department complaining of headache and persistent left periorbital pain after a seizure with head injury on October 5. While awaiting assessment in the emergency department lobby he had a seizure episode. Per EMT it was about two minutes of seizure activity. Patient does not recall what occurred during the episode. It is unclear if he hit his head again during this episode. Patient states that it feels like he is more swollen near his left eye than baseline. No vision changes. No fevers, nausea, vomiting, diarrhea. He has no medical problems and takes no medications. He does have a history of prior seizures but is not on medications for them. He states that since his seizure on October 5 he did cut down his drinking. He now drinks three days a week and drinks either two beers or half a pint when he does drink. No drug use. He lives with his sister.    Past Medical History:  Diagnosis Date  . Alcohol abuse    fell in 2006 and had "brain bleed"  . Seizures [facility])     Patient Active Problem List   Diagnosis Date Noted  . CAP (community acquired pneumonia) 10/02/2018  . PNA (pneumonia) 10/02/2018  . Syncope 12/01/2012  . Subarachnoid hematoma ([place]) 12/01/2012  . Nasal fracture 12/01/2012  . Periorbital contusion of left eye 12/01/2012  . Alcoholism ([place]) 12/01/2012    Past Surgical History:  Procedure Laterality Date  . HERNIA REPAIR    . right shoulder surgery.         Family History  Problem Relation Age of Onset  . Stroke Father     Social History    Tobacco Use  . Smoking status: Current Every Day Smoker    Packs/day: 1.00    Types: Cigars  . Smokeless tobacco: Never Used  Substance Use Topics  . Alcohol use: Yes    Comment: 1 pint of beer, and a 1/4 pint of liquor   . Drug use: No    Home Medications Prior to Admission medications   Medication Sig Start Date End Date Taking? Authorizing Provider  Ascorbic Acid (VITAMIN C PO) Take 1 tablet by mouth daily.   Yes [provider]  ibuprofen (ADVIL) 200 MG tablet Take 600-800 mg by mouth every 6 (six) hours as needed (pain).   Yes [provider]  VITAMIN D PO Take 1 tablet by mouth daily.   Yes [provider]  butalbital-acetaminophen-caffeine (FIORICET) 50-325-40 MG tablet Take 1-2 tablets by mouth every 6 (six) hours as needed for headache. Patient not taking: Reported on 07/10/2020 06/15/20 06/15/21  Domenic Moras, PA-C  levETIRAcetam (KEPPRA) 500 MG tablet Take 1 tablet (500 mg total) by mouth 2 (two) times daily. 07/10/20   Quintella Reichert, MD  lidocaine (LIDODERM) 5 % Place 1 patch onto the skin daily. Remove & Discard patch within 12 hours or as directed by MD 07/10/20   Quintella Reichert, MD    Allergies    Patient has no known allergies.  Review of Systems   Review  of Systems  Neurological: Positive for headaches.  All other systems reviewed and are negative.   Physical Exam Updated Vital Signs BP (!) 160/100   Pulse 95   Temp 98.7 F (37.1 C) (Oral)   Resp (!) 21   Ht 6' (1.829 m)   Wt 79.4 kg   SpO2 93%   BMI 23.73 kg/m   Physical Exam Vitals and nursing note reviewed.  Constitutional:      Appearance: He is well-developed.  HENT:     Head: Normocephalic.     Comments: Large firm swelling to the left eyebrow that is tender to palpation. No local ecchymosis. Pupils equal round and reactive, EOM I. Small amount of blood at the corner of the mouth. No clear intraoral laceration's. Cardiovascular:     Rate and Rhythm: Normal  rate and regular rhythm.     Heart sounds: No murmur heard.   Pulmonary:     Effort: Pulmonary effort is normal. No respiratory distress.     Breath sounds: Normal breath sounds.  Abdominal:     Palpations: Abdomen is soft.     Tenderness: There is no abdominal tenderness. There is no guarding or rebound.  Musculoskeletal:        General: No tenderness.  Skin:    General: Skin is warm and dry.  Neurological:     Mental Status: He is alert.     Comments: Mildly confused but oriented to person, place, time. Five out of five strength in all four extremities with sensation to light touch intact in all four extremities.  Psychiatric:        Behavior: Behavior normal.     ED Results / Procedures / Treatments   Labs (all labs ordered are listed, but only abnormal results are displayed) Labs Reviewed  COMPREHENSIVE METABOLIC PANEL - Abnormal; Notable for the following components:      Result Value   Potassium 3.4 (*)    Chloride 97 (*)    CO2 20 (*)    Glucose, Bld 120 (*)    BUN 5 (*)    AST 64 (*)    Anion gap 18 (*)    All other components within normal limits  CBC WITH DIFFERENTIAL/PLATELET - Abnormal; Notable for the following components:   RBC 3.71 (*)    MCV 109.4 (*)    MCH 36.4 (*)    Platelets 71 (*)    All other components within normal limits  MAGNESIUM - Abnormal; Notable for the following components:   Magnesium 1.6 (*)    All other components within normal limits  CBG MONITORING, ED - Abnormal; Notable for the following components:   Glucose-Capillary 118 (*)    All other components within normal limits  ETHANOL  RAPID URINE DRUG SCREEN, HOSP PERFORMED    EKG EKG Interpretation  Date/Time:  Monday July 10 2020 16:23:19 EST Ventricular Rate:  85 PR Interval:    QRS Duration: 82 QT Interval:  396 QTC Calculation: 471 R Axis:   56 Text Interpretation: Sinus rhythm ST elev, probable normal early repol pattern Confirmed by Quintella Reichert [phone]) on  07/10/2020 4:52:21 PM   Radiology CT Head Wo Contrast  Result Date: 07/10/2020 CLINICAL DATA:  Status post trauma. EXAM: CT HEAD WITHOUT CONTRAST TECHNIQUE: Contiguous axial images were obtained from the base of the skull through the vertex without intravenous contrast. COMPARISON:  June 15, 2020 FINDINGS: Brain: No evidence of acute infarction, hemorrhage, hydrocephalus, extra-axial collection or mass lesion/mass effect. Vascular: No  hyperdense vessel or unexpected calcification. Skull: Normal. Negative for fracture or focal lesion. Sinuses/Orbits: No acute finding. Other: There is marked severity left supra orbital and left frontal scalp soft tissue swelling. An associated 2.9 cm x 1.5 cm left supra orbital hematoma is seen. A stable left frontal scalp lipoma is noted. IMPRESSION: 1. Marked severity left supra orbital and left frontal scalp soft tissue swelling with associated 2.9 cm x 1.5 cm left supra orbital hematoma. 2. No acute intracranial abnormality. Electronically Signed   By: Virgina Norfolk M.D.   On: 07/10/2020 16:45    Procedures Procedures (including critical care time)  Medications Ordered in ED Medications  thiamine (B-1) injection 100 mg (100 mg Intravenous Given 07/10/20 1706)  lidocaine-EPINEPHrine-tetracaine (LET) topical gel (3 mLs Topical Given 07/10/20 1805)  magnesium sulfate IVPB 2 g 50 mL (0 g Intravenous Stopped 07/10/20 1953)  levETIRAcetam (KEPPRA) IVPB 1000 mg/100 mL premix (0 mg Intravenous Stopped 07/10/20 2108)    ED Course  I have reviewed the triage vital signs and the nursing notes.  Pertinent labs & imaging results that were available during my care of the patient were reviewed by me and considered in my medical decision making (see chart for details).    MDM Rules/Calculators/A&P                         patient with history of seizure disorder, alcohol abuse here for evaluation of progressive left forehead pain and periocular pain following a  seizure with hidden injury over one month ago. While awaiting ED evaluation patient did have a witness seizure. He had recurrent head injury with trauma to the area that was previously hurting. Confirmed with mother that the hematoma to his left eyebrow is new following this new seizure event. No evidence of acute ocular injury. He does have a large hematoma, given his thrombocytopenia do not feel that drainage at this time would be beneficial to him as it will likely re-accumulate. Discussed with neurologist on-call, given his history of seizures will start on Keppra and give a Keppra load. Case management consulted for assistance with establishing follow-up. Patient does not appear to be in acute alcohol withdrawal at this time. Discussed with patient home care for seizures as well as hematoma. Discussed outpatient follow-up and return precautions.  Final Clinical Impression(s) / ED Diagnoses Final diagnoses:  Seizure ([place])  Traumatic hematoma of forehead, initial encounter  Alcohol abuse  Thrombocytopenia ([place])  Hypomagnesemia    Rx / DC Orders ED Discharge Orders         Ordered    lidocaine (LIDODERM) 5 %  Every 24 hours        07/10/20 2058    levETIRAcetam (KEPPRA) 500 MG tablet  2 times daily        07/10/20 2058           Quintella Reichert, MD 07/10/20 2233

## 2020-07-10 NOTE — ED Triage Notes (Signed)
Pt reports seizure on 10/5-states continued pain to L eye and left side of head with edema present. Denies any other seizure activity.

## 2020-07-10 NOTE — ED Notes (Signed)
Assumed care. Per EMT, patient initial complaint of left sided head pain r/t seizure a few months ago with bump to left eyebrow. EMT reports patient had a seizure lasting approx 2 min in wr. Patient brought back to ED 29. Upon arrival, patient alert and oriented. States hx of seizures but does not take any medications. States he was offered medications when he had his last seizure but refused. Large bump noted to left upper eyebrow area. IV established. Dr. Ralene Bathe at bedside. Seizure precautions initiated and maintained. Will continue to monitor.

## 2020-07-17 ENCOUNTER — Ambulatory Visit: Payer: Self-pay | Admitting: *Deleted

## 2020-07-17 NOTE — Telephone Encounter (Signed)
patient's mother called in to report increase pain patient's reported since last Tuesday after a fall from a seizure. Patient has been having seizures since Oct. 5 2021. Patient had another seizure within the past few weeks and fell and reinjured left eye. Left eye swollen and forehead swollen. Patient reports pain constant with burning sensation to left eye and forehead area. Patient reports he can not voluntarily open left eye and is required to open left eyelid with his fingers due to swelling. C/o light is painful and wears sunglasses if he has to go outside.  Bilateral eyes water, no other drainage reported from eyes. Patient and mother requesting to get MRI. F/u appt scheduled for 08/09/20. No earlier appt noted. Encouraged patient to go to ED due to having another seizure and he fell and hit same eye as he did in October after 1st seizure. Care advise given. Patient verbalized understanding of care advise and to call back or go to [facility] or ED if symptoms worsen. Patient requesting to be notified if earlier OV available this week.   Reason for Disposition . Looking at light causes severe pain (i.e., photophobia)  Answer Assessment - Initial Assessment Questions 1. ONSET: "When did the pain start?" (e.g., minutes, hours, days)     Last Tuesday 07/11/20 2. TIMING: "Does the pain come and go, or has it been constant since it started?" (e.g., constant, intermittent, fleeting)     Constant pain in left eye and middle of forehead 3. SEVERITY: "How bad is the pain?"   (Scale 1-10; mild, moderate or severe)   - MILD (1-3): doesn't interfere with normal activities    - MODERATE (4-7): interferes with normal activities or awakens from sleep    - SEVERE (8-10): excruciating pain and patient unable to do normal activities     severe 4. LOCATION: "Where does it hurt?"  (e.g., eyelid, eye, cheekbone)     Left eye and middle of forehead 5. CAUSE: "What do you think is causing the pain?"     After a fall from a  seizure 6. VISION: "Do you have blurred vision or changes in your vision?"      Photophobia. Eyes water and cannot open left eye without using fingers to open eyelid. 7. EYE DISCHARGE: "Is there any discharge (pus) from the eye(s)?"  If yes, ask: "What color is it?"      Watery eyes  8. FEVER: "Do you have a fever?" If Yes, ask: "What is it, how was it measured, and when did it start?"      no 9. OTHER SYMPTOMS: "Do you have any other symptoms?" (e.g., headache, nasal discharge, facial rash)     Head, middle of forehead feels soft and pain is burning in left eye and forehead.  Protocols used: EYE PAIN-A-AH

## 2020-07-20 ENCOUNTER — Other Ambulatory Visit: Payer: Self-pay

## 2020-07-20 ENCOUNTER — Ambulatory Visit: Payer: Self-pay | Attending: Physician Assistant | Admitting: Physician Assistant

## 2020-07-20 ENCOUNTER — Other Ambulatory Visit: Payer: Self-pay | Admitting: Physician Assistant

## 2020-07-20 ENCOUNTER — Encounter: Payer: Self-pay | Admitting: Physician Assistant

## 2020-07-20 VITALS — BP 147/93 | HR 79 | Temp 98.7°F | Resp 18 | Ht 72.0 in | Wt 173.0 lb

## 2020-07-20 DIAGNOSIS — S0512XS Contusion of eyeball and orbital tissues, left eye, sequela: Secondary | ICD-10-CM

## 2020-07-20 DIAGNOSIS — R569 Unspecified convulsions: Secondary | ICD-10-CM

## 2020-07-20 DIAGNOSIS — S066X1S Traumatic subarachnoid hemorrhage with loss of consciousness of 30 minutes or less, sequela: Secondary | ICD-10-CM

## 2020-07-20 MED ORDER — LEVETIRACETAM 500 MG PO TABS: 500.0000 mg | ORAL_TABLET | Freq: Two times a day (BID) | ORAL | 0 refills | Status: DC

## 2020-07-20 MED ORDER — ACETAMINOPHEN-CODEINE #3 300-30 MG PO TABS: 1.0000 | ORAL_TABLET | ORAL | 0 refills | Status: DC | PRN

## 2020-07-20 MED FILL — levETIRAcetam 500 MG TABS: 500 | 30 days supply | Qty: 60 | Fill #0

## 2020-07-20 MED FILL — ACETAMINOPHEN-COD #3 TABLET: 300-30 | 5 days supply | Qty: 30 | Fill #0

## 2020-07-20 NOTE — Patient Instructions (Signed)
I encourage you to continue using Tylenol over-the-counter, ice packs, and then you can use Tylenol 3 for severe pain.  I sent the prescription for Tylenol 3 and Keppra refills to the pharmacy [facility].  Please return to the clinic tomorrow morning for fasting labs, you will follow-up with me after your MRI.  Michael Rad, PA-C Physician Assistant [facility] [email or web address]    Hematoma A hematoma is a collection of blood under the skin, in an organ, in a body space, in a joint space, or in other tissue. The blood can thicken (clot) to form a lump that you can see and feel. The lump is often firm and may become sore and tender. Most hematomas get better in a few days to weeks. However, some hematomas may be serious and require medical care. Hematomas can range from very small to very large. What are the causes? This condition is caused by:  A blunt or penetrating injury.  A leakage from a blood vessel under the skin.  Some medical procedures, including surgeries, such as oral surgery, face lifts, and surgeries on the joints.  Some medical conditions that cause bleeding or bruising. There may be multiple hematomas that appear in different areas of the body. What increases the risk? You are more likely to develop this condition if:  You are an older adult.  You use blood thinners. What are the signs or symptoms?  Symptoms of this condition depend on where the hematoma is located.  Common symptoms of a hematoma that is under the skin include:  A firm lump on the body.  Pain and tenderness in the area.  Bruising. Blue, dark blue, purple-red, or yellowish skin (discoloration) may appear at the site of the hematoma if the hematoma is close to the surface of the skin. Common symptoms of a hematoma that is deep in the tissues or body spaces may be less obvious. They include:  A  collection of blood in the stomach (intra-abdominal hematoma). This may cause pain in the abdomen, weakness, fainting, and shortness of breath.  A collection of blood in the head (intracranial hematoma). This may cause a headache or symptoms such as weakness, trouble speaking or understanding, or a change in consciousness. How is this diagnosed? This condition is diagnosed based on:  Your medical history.  A physical exam.  Imaging tests, such as an ultrasound or CT scan. These may be needed if your health care provider suspects a hematoma in deeper tissues or body spaces.  Blood tests. These may be needed if your health care provider believes that the hematoma is caused by a medical condition. How is this treated? Treatment for this condition depends on the cause, size, and location of the hematoma. Treatment may include:  Doing nothing. The majority of hematomas do not need treatment as many of them go away on their own over time.  Surgery or close monitoring. This may be needed for large hematomas or hematomas that affect vital organs.  Medicines. Medicines may be given if there is an underlying medical cause for the hematoma. Follow these instructions at home: Managing pain, stiffness, and swelling   If directed, put ice on the affected area. ? Put ice in a plastic bag. ? Place a towel between your skin and the bag. ? Leave the ice on for 20 minutes, 2-3 times a day for the first couple of days.  If directed, apply heat to the affected area after  applying ice for a couple of days. Use the heat source that your health care provider recommends, such as a moist heat pack or a heating pad. ? Place a towel between your skin and the heat source. ? Leave the heat on for 20-30 minutes. ? Remove the heat if your skin turns bright red. This is especially important if you are unable to feel pain, heat, or cold. You may have a greater risk of getting burned.  Raise (elevate) the affected  area above the level of your heart while you are sitting or lying down.  If told, wrap the affected area with an elastic bandage. The bandage applies pressure (compression) to the area, which may help to reduce swelling and promote healing. Do not wrap the bandage too tightly around the affected area.  If your hematoma is on a leg or foot (lower extremity) and is painful, your health care provider may recommend crutches. Use them as told by your health care provider. General instructions  Take over-the-counter and prescription medicines only as told by your health care provider.  Keep all follow-up visits as told by your health care provider. This is important. Contact a health care provider if:  You have a fever.  The swelling or discoloration gets worse.  You develop more hematomas. Get help right away if:  Your pain is worse or your pain is not controlled with medicine.  Your skin over the hematoma breaks or starts bleeding.  Your hematoma is in your chest or abdomen and you have weakness, shortness of breath, or a change in consciousness.  You have a hematoma on your scalp that is caused by a fall or injury, and you also have: ? A headache that gets worse. ? Trouble speaking or understanding speech. ? Weakness. ? Change in alertness or consciousness. Summary  A hematoma is a collection of blood under the skin, in an organ, in a body space, in a joint space, or in other tissue.  This condition usually does not need treatment because many hematomas go away on their own over time.  Large hematomas, or those that may affect vital organs, may need surgical drainage or monitoring. If the hematoma is caused by a medical condition, medicines may be prescribed.  Get help right away if your hematoma breaks or starts to bleed, you have shortness of breath, or you have a headache or trouble speaking after a fall. This information is not intended to replace advice given to you by your  health care provider. Make sure you discuss any questions you have with your health care provider. Document Revised: 12/30/2018 Document Reviewed: 01/08/2018 Elsevier Patient Education  2020 [employer].

## 2020-07-20 NOTE — Progress Notes (Signed)
New Patient Office Visit  Subjective:  Patient ID: Michael Hobbs, male    DOB: 05-17-1976  Age: 44 y.o. MRN: [id number]  CC:  Chief Complaint  Patient presents with  . Seizures    HPI Michael Hobbs reports that he was seen in the emergency room on May 23, 2020 after having a fall at work injuring his left eye.   Apt with dr. Joya Gaskins to establish 09/04/20  07/17/20 nurse call  patient's mother called in to report increase pain patient's reported since last Tuesday after a fall from a seizure. Patient has been having seizures since Oct. 5 2021. Patient had another seizure within the past few weeks and fell and reinjured left eye. Left eye swollen and forehead swollen. Patient reports pain constant with burning sensation to left eye and forehead area. Patient reports he can not voluntarily open left eye and is required to open left eyelid with his fingers due to swelling. C/o light is painful and wears sunglasses if he has to go outside.  Bilateral eyes water, no other drainage reported from eyes. Patient and mother requesting to get MRI. F/u appt scheduled for 08/09/20. No earlier appt noted. Encouraged patient to go to ED due to having another seizure and he fell and hit same eye as he did in October after 1st seizure. Care advise given. Patient verbalized understanding of care advise and to call back or go to [facility] or ED if symptoms worsen. Patient requesting to be notified if earlier OV available this week.   Note from ED visit May 23, 2020  Wound repaired by PA student under supervision of [place], [place]. There is a larger hematoma now post-repair with some oozing of blood from the wound. I am concerned that there is a venous bleeder underneath that may still be oozing. I offered to take down his stitches, irrigate his wound and explore to identify a bleeding vessel but he declines, states he just wants to go home. Given standard wound care advise, advised that he may continue to ooze  from his wound and to return to the ED if it does not stop. He would like resource guide for substance abuse treatment. Advised to stop drinking, no driving until seizure free for 6 months. PCP follow up.    Reports that he continued to have persistent and severe pain above his left eye and presented to the emergency room again on July 10, 2020.  ED visit 07/10/20                         patient with history of seizure disorder, alcohol abuse here for evaluation of progressive left forehead pain and periocular pain following a seizure with hidden injury over one month ago. While awaiting ED evaluation patient did have a witness seizure. He had recurrent head injury with trauma to the area that was previously hurting. Confirmed with mother that the hematoma to his left eyebrow is new following this new seizure event. No evidence of acute ocular injury. He does have a large hematoma, given his thrombocytopenia do not feel that drainage at this time would be beneficial to him as it will likely re-accumulate. Discussed with neurologist on-call, given his history of seizures will start on Keppra and give a Keppra load. Case management consulted for assistance with establishing follow-up. Patient does not appear to be in acute alcohol withdrawal at this time. Discussed with patient home care for seizures as well as hematoma.  Discussed outpatient follow-up and return precautions.  Reports today that he continues to have disabling pain of his left eye, states that he has been using over-the-counter pain relievers and ice packs without much relief.  Reports that he has been taking the Keppra as directed, has not had any seizures since July 10, 2020.    Past Medical History:  Diagnosis Date  . Alcohol abuse    fell in 2006 and had "brain bleed"  . Seizures ([place])     Past Surgical History:  Procedure Laterality Date  . HERNIA REPAIR    . right shoulder surgery.      Family History  Problem  Relation Age of Onset  . Stroke Father     Social History   Socioeconomic History  . Marital status: Single    Spouse name: Not on file  . Number of children: Not on file  . Years of education: Not on file  . Highest education level: Not on file  Occupational History  . Not on file  Tobacco Use  . Smoking status: Current Every Day Smoker    Packs/day: 1.00    Types: Cigars  . Smokeless tobacco: Never Used  Substance and Sexual Activity  . Alcohol use: Yes    Comment: 1 pint of beer, and a 1/4 pint of liquor   . Drug use: No  . Sexual activity: Not Currently  Other Topics Concern  . 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 Difficulty of Paying Living Expenses: Not on file  Food Insecurity:   . Worried About Charity fundraiser in the Last Year: Not on file  . Ran Out of Food in the Last Year: Not on file  Transportation Needs:   . Lack of Transportation (Medical): Not on file  . Lack of Transportation (Non-Medical): Not on file  Physical Activity:   . Days of Exercise per Week: Not on file  . Minutes of Exercise per Session: Not on file  Stress:   . Feeling of Stress : Not on file  Social Connections:   . Frequency of Communication with Friends and Family: Not on file  . Frequency of Social Gatherings with Friends and Family: Not on file  . Attends Religious Services: Not on file  . Active Member of Clubs or Organizations: Not on file  . Attends Archivist Meetings: Not on file  . Marital Status: Not on file  Intimate Partner Violence:   . Fear of Current or Ex-Partner: Not on file  . Emotionally Abused: Not on file  . Physically Abused: Not on file  . Sexually Abused: Not on file     ROS Review of Systems  Constitutional: Negative.   HENT: Negative.   Eyes: Positive for pain, discharge and visual disturbance.  Respiratory: Negative.   Cardiovascular: Negative.   Gastrointestinal: Negative.   Endocrine: Negative.   Genitourinary: Negative.   Musculoskeletal: Negative.   Skin: Positive for wound.  Allergic/Immunologic: Negative.   Neurological: Positive for headaches. Negative for seizures.  Hematological: Negative.   Psychiatric/Behavioral: Negative.     Objective:   Today's Vitals: BP (!) 147/93 (BP Location: Left Arm, Patient Position: Sitting, Cuff  Size: Normal)   Pulse 79   Temp 98.7 F (37.1 C) (Oral)   Resp 18   Ht 6' (1.829 m)   Wt 173 lb (78.5 kg)   SpO2 99%   BMI 23.46 kg/m   Physical Exam Vitals and nursing note reviewed.  Constitutional:      General: He is not in acute distress.    Appearance: Normal appearance. He is not ill-appearing.  HENT:     Head: Left periorbital erythema present.      Right Ear: External ear normal.     Left Ear: External ear normal.     Mouth/Throat:     Mouth: Mucous membranes are dry.     Pharynx: Oropharynx is clear.  Eyes:     Extraocular Movements:     Right eye: Normal extraocular motion.     Conjunctiva/sclera:     Left eye: Left conjunctiva is injected.  Cardiovascular:     Rate and Rhythm: Normal rate and regular rhythm.     Pulses: Normal pulses.     Heart sounds: Normal heart sounds.  Pulmonary:     Effort: Pulmonary effort is normal.     Breath sounds: Normal breath sounds.  Abdominal:     General: Abdomen is flat. Bowel sounds are normal.  Musculoskeletal:        General: Normal range of motion.     Cervical back: Normal range of motion and neck supple.  Skin:    General: Skin is warm.  Neurological:     Mental Status: He is alert and oriented to person, place, and time.  Psychiatric:        Mood and Affect: Mood normal.        Behavior: Behavior normal.        Thought  Content: Thought content normal.        Judgment: Judgment normal.     Assessment & Plan:   Problem List Items Addressed This Visit      Other   Subarachnoid hematoma (HCC) - Primary   Relevant Medications   acetaminophen-codeine (TYLENOL #3) 300-30 MG tablet   Other Relevant Orders   MR Brain W Wo Contrast   Periorbital contusion of left eye   Relevant Medications   acetaminophen-codeine (TYLENOL #3) 300-30 MG tablet    Other Visit Diagnoses    Seizures ([place])       Relevant Medications   levETIRAcetam (KEPPRA) 500 MG tablet      Outpatient Encounter Medications as of 07/20/2020  Medication Sig  . Ascorbic Acid (VITAMIN C PO) Take 1 tablet by mouth daily.  Marland Kitchen ibuprofen (ADVIL) 200 MG tablet Take 600-800 mg by mouth every 6 (six) hours as needed (pain).  Marland Kitchen levETIRAcetam (KEPPRA) 500 MG tablet Take 1 tablet (500 mg total) by mouth 2 (two) times daily.  Marland Kitchen VITAMIN D PO Take 1 tablet by mouth daily.  . [DISCONTINUED] levETIRAcetam (KEPPRA) 500 MG tablet Take 1 tablet (500 mg total) by mouth 2 (two) times daily.  Marland Kitchen acetaminophen-codeine (TYLENOL #3) 300-30 MG tablet Take 1 tablet by mouth every 4 (four) hours as needed for up to 7 days for moderate pain.  Marland Kitchen lidocaine (LIDODERM) 5 % Place 1 patch onto the skin daily. Remove & Discard patch within 12 hours or as directed by MD (Patient not taking: Reported on 07/20/2020)  . [DISCONTINUED] butalbital-acetaminophen-caffeine (FIORICET) 50-325-40 MG tablet Take 1-2 tablets by mouth every 6 (six) hours as needed for headache. (Patient not taking: Reported on 07/10/2020)  No facility-administered encounter medications on file as of 07/20/2020.  1. Subarachnoid hematoma, with loss of consciousness of 30 minutes or less, sequela (HCC) Continue using over-the-counter pain relievers, ice packs, Tylenol 3 for severe pain.  [facility]  Patient has upcoming  appointment with Dr. Joya Gaskins on September 04, 2020  FINDINGS: Brain: No evidence of acute infarction, hemorrhage, hydrocephalus, extra-axial collection or mass lesion/mass effect.  Vascular: No hyperdense vessel or unexpected calcification.  Skull: Normal. Negative for fracture or focal lesion.  Sinuses/Orbits: No acute finding.  Other: There is marked severity left supra orbital and left frontal scalp soft tissue swelling. An associated 2.9 cm x 1.5 cm left supra orbital hematoma is seen.  A stable left frontal scalp lipoma is noted.  IMPRESSION: 1. Marked severity left supra orbital and left frontal scalp soft tissue swelling with associated 2.9 cm x 1.5 cm left supra orbital hematoma. 2. No acute intracranial abnormality.   Electronically Signed   By: Virgina Norfolk M.D.   On: 07/10/2020 16:45 - MR Brain W Wo Contrast; Future - acetaminophen-codeine (TYLENOL #3) 300-30 MG tablet; Take 1 tablet by mouth every 4 (four) hours as needed for up to 7 days for moderate pain.  Dispense: 30 tablet; Refill: 0  2. Periorbital contusion of left eye, sequela  - acetaminophen-codeine (TYLENOL #3) 300-30 MG tablet; Take 1 tablet by mouth every 4 (four) hours as needed for up to 7 days for moderate pain.  Dispense: 30 tablet; Refill: 0  3. Seizures (HCC)  - levETIRAcetam (KEPPRA) 500 MG tablet; Take 1 tablet (500 mg total) by mouth 2 (two) times daily.  Dispense: 60 tablet; Refill: 0   I have reviewed the patient's medical history (PMH, PSH, Social History, Family History, Medications, and allergies) , and have been updated if relevant. I spent 30 minutes reviewing chart and  face to face time with patient.    Follow-up: Return in about 6 weeks (around 08/31/2020) for At [facility], To establish PCP.   Loraine Grip Mayers, PA-C

## 2020-07-20 NOTE — Progress Notes (Signed)
Patient has not taken medication today or eaten. Patient has not had a Seizure since Hospital stay. Patient complains of pain in the front of his head since oct 10th. Patient feels burning above his left eye with swelling scaled on a 10. Patient shares injury over eye resulted from the fall from his seizure at work.

## 2020-07-21 ENCOUNTER — Ambulatory Visit: Payer: Self-pay | Attending: Family Medicine

## 2020-07-21 ENCOUNTER — Other Ambulatory Visit: Payer: Self-pay | Admitting: Family Medicine

## 2020-07-21 DIAGNOSIS — S066X1S Traumatic subarachnoid hemorrhage with loss of consciousness of 30 minutes or less, sequela: Secondary | ICD-10-CM

## 2020-07-22 LAB — LIPID PANEL
Chol/HDL Ratio: 3.5 ratio (ref 0.0–5.0)
Cholesterol, Total: 213 mg/dL — ABNORMAL HIGH (ref 100–199)
HDL: 61 mg/dL (ref >39)
LDL Chol Calc (NIH): 130 mg/dL — ABNORMAL HIGH (ref 0–99)
Triglycerides: 126 mg/dL (ref 0–149)
VLDL Cholesterol Cal: 22 mg/dL (ref 5–40)

## 2020-07-22 LAB — BASIC METABOLIC PANEL
BUN/Creatinine Ratio: 9 (ref 9–20)
BUN: 7 mg/dL (ref 6–24)
CO2: 24 mmol/L (ref 20–29)
Calcium: 9.2 mg/dL (ref 8.7–10.2)
Chloride: 100 mmol/L (ref 96–106)
Creatinine, Ser: 0.77 mg/dL (ref 0.76–1.27)
GFR calc Af Amer: 128 mL/min/{1.73_m2} (ref >59)
GFR calc non Af Amer: 111 mL/min/{1.73_m2} (ref >59)
Glucose: 77 mg/dL (ref 65–99)
Potassium: 4.1 mmol/L (ref 3.5–5.2)
Sodium: 141 mmol/L (ref 134–144)

## 2020-07-25 ENCOUNTER — Telehealth: Payer: Self-pay

## 2020-07-25 DIAGNOSIS — R569 Unspecified convulsions: Secondary | ICD-10-CM | POA: Insufficient documentation

## 2020-07-25 NOTE — Telephone Encounter (Signed)
-----   Message from Charlott Rakes, MD sent at 07/24/2020 11:47 AM EST ----- Cholesterol is slightly elevated.  Please advise to comply with a low-cholesterol diet and exercise and labs will be repeated in the next 6 months to follow-up.

## 2020-07-25 NOTE — Telephone Encounter (Signed)
Patient name and DOB has been verified Patient was informed of lab results. Patient had no questions.  

## 2020-07-28 ENCOUNTER — Ambulatory Visit (HOSPITAL_COMMUNITY)
Admission: RE | Admit: 2020-07-28 | Discharge: 2020-07-28 | Disposition: A | Discharge status: Home / Self Care | Payer: Self-pay | Source: Ambulatory Visit | Attending: Physician Assistant | Admitting: Physician Assistant

## 2020-07-28 ENCOUNTER — Other Ambulatory Visit: Payer: Self-pay

## 2020-07-28 DIAGNOSIS — S066X1S Traumatic subarachnoid hemorrhage with loss of consciousness of 30 minutes or less, sequela: Secondary | ICD-10-CM

## 2020-07-28 MED ORDER — GADOTERIDOL 279.3 MG/ML IV SOLN
7.5000 mL | Freq: Once | INTRAVENOUS | Status: AC | PRN
  Administered 2020-07-28: 7.5 mL via INTRAVENOUS
  Filled 2020-07-28: qty 7.5

## 2020-07-31 ENCOUNTER — Telehealth: Payer: Self-pay | Admitting: *Deleted

## 2020-07-31 NOTE — Telephone Encounter (Signed)
Patient is calling for results:  Please call patient and tell him that his MRI of his brain was negative for any acute changes, they did note that his hematoma over his left eye has decreased in size. Patient states he is in a lot of pain- patient needs to have appointment with neurology. Call to [facility]- they are going to contact patient.

## 2020-08-03 ENCOUNTER — Telehealth: Payer: Self-pay | Admitting: *Deleted

## 2020-08-03 NOTE — Telephone Encounter (Signed)
MA unable to reach patient. MA left a message on patients mothers cell phone asking for patient to return a phone call for his imagining results at [phone].

## 2020-08-03 NOTE — Telephone Encounter (Signed)
-----   Message from Michael Hobbs, [place] sent at 07/31/2020  8:34 AM EST ----- Please call patient and tell him that his MRI of his brain was negative for any acute changes, they did note that his hematoma over his left eye has decreased in size.

## 2020-08-03 NOTE — Telephone Encounter (Signed)
Patient verified DOB Patient is aware of MRI showing no acute concerns and patient is made aware of HEMATOMA decreasing in size. Patient advised to keep 12/23 appointment to discuss ongoing eye pain.

## 2020-08-09 ENCOUNTER — Other Ambulatory Visit: Payer: Self-pay

## 2020-08-09 ENCOUNTER — Ambulatory Visit: Payer: Self-pay | Admitting: Family Medicine

## 2020-08-09 ENCOUNTER — Inpatient Hospital Stay: Payer: Self-pay | Admitting: Physician Assistant

## 2020-08-09 ENCOUNTER — Ambulatory Visit: Payer: Self-pay | Attending: Family Medicine

## 2020-08-10 ENCOUNTER — Ambulatory Visit: Payer: Self-pay | Attending: Family Medicine | Admitting: Internal Medicine

## 2020-08-10 ENCOUNTER — Encounter: Payer: Self-pay | Admitting: Internal Medicine

## 2020-08-10 DIAGNOSIS — S0512XD Contusion of eyeball and orbital tissues, left eye, subsequent encounter: Secondary | ICD-10-CM

## 2020-08-10 MED ORDER — GABAPENTIN 300 MG PO CAPS: ORAL_CAPSULE | ORAL | 0 refills | Status: DC

## 2020-08-10 MED FILL — GABAPENTIN 300 MG CAPSULE: 300 | 33 days supply | Qty: 60 | Fill #0

## 2020-08-10 NOTE — Patient Instructions (Signed)
Take medications as prescribed.  I anticipate that your pain will resolve over the next six weeks

## 2020-08-10 NOTE — Assessment & Plan Note (Signed)
Patient continues to have discomfort.  I think what is happening is that he damaged the nerve serving the left periorbital area.  I think this is caused the equivalent of a neuritis.  I talked to him about this.  I also reviewed all of his recent imaging studies.  There is no concern on CT scanning or MRI findings.  I will treat with gabapentin 300 mg p.o. nightly for 1 week and then 300 mg p.o. twice daily I do not think he will need this for longer than 1 month.

## 2020-08-10 NOTE — Progress Notes (Signed)
Pain in left side of head above left eye.  Pain 10/10 constant Pain ongoing since October after he had a seizure and sustained a laceration to his left side of his head. He is not sure if the provider hit a nerve but has had pain since then.   Left eye is sensitive to light and wakes up with discharge on the eye in the morning.  October 5th sz at work. He has long hx of seizures. He says he hit head on October 5th, required stitches and now he continues to have pain around that area. He says that area feels numb and painful a th the same time.   Past Medical History:  Diagnosis Date  . Alcohol abuse    fell in 2006 and had "brain bleed"  . Seizures ([place])     Social History   Socioeconomic History  . Marital status: Single    Spouse name: Not on file  . Number of children: Not on file  . Years of education: Not on file  . Highest education level: Not on file  Occupational History  . Not on file  Tobacco Use  . Smoking status: Current Every Day Smoker    Packs/day: 1.00    Types: Cigars  . Smokeless tobacco: Never Used  Substance and Sexual Activity  . Alcohol use: Yes    Comment: 1 pint of beer, and a 1/4 pint of liquor   . Drug use: No  . Sexual activity: Not Currently  Other Topics Concern  . 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 HERNIA REPAIR    . right shoulder surgery.      Family History  Problem Relation Age of Onset  . Stroke  Father     No Known Allergies  Current Outpatient Medications on File Prior to Visit  Medication Sig Dispense Refill  . acetaminophen-codeine (TYLENOL #3) 300-30 MG tablet Take 1-2 tablets by mouth every 4 (four) hours as needed for moderate pain.    Marland Kitchen levETIRAcetam (KEPPRA) 500 MG tablet Take 1 tablet (500 mg total) by mouth 2 (two) times daily. 60 tablet 0  . VITAMIN D PO Take 1 tablet by mouth daily.     No current facility-administered medications on file prior to visit.     patient denies chest pain, shortness of breath, orthopnea. Denies lower extremity edema, abdominal pain, change in appetite, change in bowel movements. Patient denies rashes, musculoskeletal complaints. No other specific complaints in a complete review of systems.   BP (!) 158/98   Pulse 78   Temp 98.6 F (37 C)   Resp 16   Wt 173 lb (78.5 kg)   SpO2 97%   BMI 23.46 kg/m   well-developed well-nourished male in no acute distress. HEENT exam atraumatic, normocephalic, neck supple without jugular venous distention. FROM eoms, no pain to palpation of orbit. No erythema of eye. Neurologic exam  is alert with a normal gait.  Post traumatic pain- I think he has resifual topical pain from fall. I suspect he has nerve related pain (trauamtic periph neuropahty)

## 2020-08-14 NOTE — Addendum Note (Signed)
Encounter addended by: Greig Castilla on: 08/14/2020 2:42 PM  Actions taken: Imaging Exam ended, Charge Capture section accepted

## 2020-08-16 NOTE — Addendum Note (Signed)
Encounter addended by: Greig Castilla on: 08/16/2020 2:40 PM  Actions taken: Imaging Exam ended, MAR administration edited, MAR administration accepted

## 2020-08-23 ENCOUNTER — Telehealth: Payer: Self-pay | Admitting: General Practice

## 2020-08-23 NOTE — Telephone Encounter (Signed)
Copied from CRM [id number]. Topic: General - Other >> Aug 23, 2020  3:42 PM Dalphine Handing A wrote: Patient wants callback from [place]. He was advised to have paperwork in for finance application by 1/4 and patient wants to know can he still bring it in

## 2020-08-24 NOTE — Telephone Encounter (Signed)
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

## 2020-08-25 ENCOUNTER — Telehealth: Payer: Self-pay | Admitting: General Practice

## 2020-08-25 NOTE — Telephone Encounter (Signed)
Pt came to the office to drop up missing documents to complete his cafa and OC application, Pt was inform that still incomplete that still missing the 3 month bank statement and the letter of support has to be sing by the person that is helping him not the Pt, Pt got upset and he said that he told me that he has no bank acct, and he is not working for over 4 month, I told him that he brought me the last month bank statement (December) this mean his acct is open an we need him to bring the bank statement that is required by [facility] financial, He has to get all his documents back and he does not want to do anything with [place]..the patient was given all the documents at this time 1:25pm 08/25/20

## 2020-09-03 NOTE — Progress Notes (Deleted)
   Subjective:    Patient ID: Michael Hobbs, male    DOB: Jun 22, 1976, 45 y.o.   MRN: [id number]  44 y.o.[name] to est PCP.       Review of Systems     Objective:   Physical Exam        Assessment & Plan:

## 2020-09-04 ENCOUNTER — Encounter: Payer: Self-pay | Admitting: Critical Care Medicine

## 2020-09-04 ENCOUNTER — Other Ambulatory Visit: Payer: Self-pay

## 2020-09-04 ENCOUNTER — Ambulatory Visit (HOSPITAL_BASED_OUTPATIENT_CLINIC_OR_DEPARTMENT_OTHER): Payer: Self-pay | Admitting: Critical Care Medicine

## 2020-09-04 DIAGNOSIS — S0512XD Contusion of eyeball and orbital tissues, left eye, subsequent encounter: Secondary | ICD-10-CM

## 2020-09-04 NOTE — Progress Notes (Signed)
Still has continuous pain

## 2020-09-05 NOTE — Progress Notes (Signed)
Patient ID: Michael Hobbs, male   DOB: 18-Feb-1976, 45 y.o.   MRN: [id number] The patient cancelled the appt

## 2020-09-06 ENCOUNTER — Ambulatory Visit (INDEPENDENT_AMBULATORY_CARE_PROVIDER_SITE_OTHER): Payer: Self-pay | Admitting: Primary Care

## 2020-09-11 ENCOUNTER — Ambulatory Visit: Payer: Self-pay

## 2020-09-11 ENCOUNTER — Telehealth (INDEPENDENT_AMBULATORY_CARE_PROVIDER_SITE_OTHER): Payer: Self-pay

## 2020-09-11 NOTE — Telephone Encounter (Signed)
Copied from [place] [id number]. Topic: Appointment Scheduling - Scheduling Inquiry for Clinic >> Sep 11, 2020 10:15 AM Alanda Slim E wrote: Reason for CRM: Pt was scheduled to see Sharyn Lull on 1.19.22/ Pt would really like to speak with Michelle/ Pt has an appt with Dr. Joya Gaskins on 2.1.22/ but pt still would like to speak with Sharyn Lull since he was scheduled to see her at one point and she reached out to him before / please advise

## 2020-09-11 NOTE — Telephone Encounter (Signed)
Called patient and address how his appointment was change. Then was advised to keep appointment with Dr. Joya Gaskins describes feeling like a fire over his left eye. New patient appt @[facility]  11/07/20 (1:50)and would not want patient to wait that long to be seen

## 2020-09-11 NOTE — Telephone Encounter (Signed)
Pt. Reports he still is having pain to left eye. States gabapentin is not helping. Has an appointment 09/19/20. Asking if Gabapentin can be increased "or something else sent to my pharmacy." Please advise pt.   Answer Assessment - Initial Assessment Questions 1. ONSET: "When did the pain start?" (e.g., minutes, hours, days)     October 2. TIMING: "Does the pain come and go, or has it been constant since it started?" (e.g., constant, intermittent, fleeting)     Constant 3. SEVERITY: "How bad is the pain?"   (Scale 1-10; mild, moderate or severe)   - MILD (1-3): doesn't interfere with normal activities    - MODERATE (4-7): interferes with normal activities or awakens from sleep    - SEVERE (8-10): excruciating pain and patient unable to do normal activities     Now - 10 4. LOCATION: "Where does it hurt?"  (e.g., eyelid, eye, cheekbone)     Eyelid and above 5. CAUSE: "What do you think is causing the pain?"     Had sutures in October 6. VISION: "Do you have blurred vision or changes in your vision?"      Blurred vision 7. EYE DISCHARGE: "Is there any discharge (pus) from the eye(s)?"  If yes, ask: "What color is it?"      No 8. FEVER: "Do you have a fever?" If Yes, ask: "What is it, how was it measured, and when did it start?"      No 9. OTHER SYMPTOMS: "Do you have any other symptoms?" (e.g., headache, nasal discharge, facial rash)     Headache 10. PREGNANCY: "Is there any chance you are pregnant?" "When was your last menstrual period?"       n/a  Protocols used: EYE PAIN-A-AH

## 2020-09-13 ENCOUNTER — Telehealth: Payer: Self-pay | Admitting: Critical Care Medicine

## 2020-09-13 NOTE — Telephone Encounter (Signed)
IN reference to the [facility] phone note routed to me that I was not able to open:  I have yet to see this patient, he no showed recently.  Dr Leanne Chang was only provider to see him  He will need to obtain a face to face appt. Any provider is ok, possibly go to Mobile team at [facility] ?   Or [facility].   He had a facial trauma so he needs to be seen face to face

## 2020-09-13 NOTE — Telephone Encounter (Signed)
Patient has an appt with [facility] operation on 09/14/2020.

## 2020-09-14 ENCOUNTER — Ambulatory Visit (INDEPENDENT_AMBULATORY_CARE_PROVIDER_SITE_OTHER): Payer: Self-pay | Admitting: Physician Assistant

## 2020-09-14 ENCOUNTER — Encounter: Payer: Self-pay | Admitting: Physician Assistant

## 2020-09-14 ENCOUNTER — Other Ambulatory Visit: Payer: Self-pay

## 2020-09-14 VITALS — BP 135/95 | HR 81 | Temp 98.4°F | Resp 18 | Ht 72.0 in | Wt 172.0 lb

## 2020-09-14 DIAGNOSIS — S066X9S Traumatic subarachnoid hemorrhage with loss of consciousness of unspecified duration, sequela: Secondary | ICD-10-CM

## 2020-09-14 NOTE — Progress Notes (Signed)
Established Patient Office Visit  Subjective:  Patient ID: Michael Hobbs, male    DOB: 01/17/1976  Age: 45 y.o. MRN: [id number]  CC:  Chief Complaint  Patient presents with  . Eye Injury    Left    HPI Michael Hobbs reports that he continues to have exquisite pain above his left eye radiating to the back of his head.  Reports that he may have 2 hours out of the day where his pain has subsided enough for him to watch videos or TV. Reports that he has been scratching himself on the top of his head and wearing down bald spots from trying to release the pain.  Reports that he did start application for [place] financial assistance, unfortunately he became frustrated with the process and did not finish.  Reports that he did not start the gabapentin.    Past Medical History:  Diagnosis Date  . Alcohol abuse    fell in 2006 and had "brain bleed"  . PNA (pneumonia) 10/02/2018  . Seizures ([place])     Past Surgical History:  Procedure Laterality Date  . HERNIA REPAIR    . right shoulder surgery.      Family History  Problem Relation Age of Onset  . Stroke Father     Social History   Socioeconomic History  . Marital status: Single    Spouse name: Not on file  . Number of children: Not on file  . Years of education: Not on file  . Highest education level: Not on file  Occupational History  . Not on file  Tobacco Use  . Smoking status: Current Every Day Smoker    Packs/day: 0.25    Types: Cigars  . Smokeless tobacco: Never Used  . Tobacco comment: unable to afford at this time  Substance and Sexual Activity  . Alcohol use: Yes    Comment: 1 pint of beer, and a 1/4 pint of liquor   . Drug use: No  . Sexual activity: Not Currently  Other Topics Concern  . 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 gabapentin (NEURONTIN) 300 MG capsule Take one pill at night for one week and then one pill two times daily. 60 capsule 0  . levETIRAcetam (KEPPRA) 500 MG tablet Take 1 tablet (500 mg total) by mouth 2 (two) times daily. 60 tablet 0   No facility-administered medications prior to visit.    No Known Allergies  ROS Review of Systems  Constitutional: Negative.   HENT: Negative.   Eyes: Positive for visual disturbance.  Respiratory: Negative.   Cardiovascular: Negative.   Gastrointestinal: Negative.   Endocrine: Negative.   Genitourinary: Negative.   Musculoskeletal: Negative.   Skin: Negative.   Allergic/Immunologic: Negative.   Neurological: Positive for headaches. Negative for dizziness, seizures and syncope.  Hematological: Negative.       Objective:  Physical Exam Vitals and nursing note reviewed.  Constitutional:      General: He is not in acute distress.    Appearance: Normal appearance. He is not ill-appearing.  HENT:     Head: Left periorbital erythema present.      Right Ear: External ear normal.     Left Ear: External ear normal.     Nose: Nose normal.     Mouth/Throat:     Mouth: Mucous membranes are dry.     Pharynx: Oropharynx is clear.  Eyes:   Cardiovascular:     Rate and Rhythm: Normal rate and regular rhythm.     Pulses: Normal pulses.     Heart sounds: Normal heart sounds.  Pulmonary:     Effort: Pulmonary effort is normal.     Breath sounds: Normal breath sounds.  Musculoskeletal:        General: Normal range of motion.     Cervical back: Normal range of  motion and neck supple.  Skin:      Neurological:     General: No focal deficit present.     Mental Status: He is alert and oriented to person, place, and time.  Psychiatric:        Mood and Affect: Mood normal.        Behavior: Behavior normal.        Thought Content: Thought content normal.        Judgment: Judgment normal.     BP (!) 135/95 (BP Location: Left Arm, Patient Position: Sitting, Cuff Size: Normal)   Pulse 81   Temp 98.4 F (36.9 C) (Oral)   Resp 18   Ht 6' (1.829 m)   Wt 172 lb (78 kg)   SpO2 98%   BMI 23.33 kg/m  Wt Readings from Last 3 Encounters:  09/14/20 172 lb (78 kg)  08/10/20 173 lb (78.5 kg)  07/20/20 173 lb (78.5 kg)     Health Maintenance Due  Topic Date Due  . Hepatitis C Screening  Never done  . COVID-19 Vaccine (1) Never done  . TETANUS/TDAP  Never done  . INFLUENZA VACCINE  Never done    There are no preventive care reminders to display for this patient.  No results found for: TSH Lab Results  Component Value Date   WBC 4.5 07/10/2020   HGB 13.5 07/10/2020   HCT 40.6 07/10/2020   MCV 109.4 (H) 07/10/2020   PLT 71 (L) 07/10/2020   Lab Results  Component Value Date   NA 141 07/21/2020   K 4.1 07/21/2020   CO2 24 07/21/2020   GLUCOSE 77 07/21/2020   BUN 7 07/21/2020   CREATININE 0.77 07/21/2020   BILITOT 0.9 07/10/2020   ALKPHOS 44 07/10/2020   AST 64 (H) 07/10/2020   ALT 24 07/10/2020   PROT 7.7 07/10/2020   ALBUMIN 4.5 07/10/2020   CALCIUM 9.2 07/21/2020   ANIONGAP 18 (H) 07/10/2020   Lab Results  Component Value Date   CHOL 213 (H) 07/21/2020   Lab Results  Component Value Date   HDL 61 07/21/2020   Lab Results  Component Value Date   LDLCALC 130 (H) 07/21/2020   Lab Results  Component Value Date   TRIG 126 07/21/2020   Lab Results  Component Value Date   CHOLHDL 3.5 07/21/2020   No results found for: HGBA1C    Assessment & Plan:   Problem List Items Addressed This Visit      Other    Subarachnoid  hematoma ([place]) - Primary     1. Subarachnoid hematoma with loss of consciousness, sequela ([place]) Encourage patient to begin trial of gabapentin, continue with financial assistance application.  Patient to follow-up with mobile medicine unit in 2 weeks   I have reviewed the patient's medical history (PMH, PSH, Social History, Family History, Medications, and allergies) , and have been updated if relevant. I spent 20 minutes reviewing chart and  face to face time with patient.     No orders of the defined types were placed in this encounter.   Follow-up: Return in about 2 weeks (around 09/28/2020).    Loraine Grip Cayetano Mikita, PA-C

## 2020-09-14 NOTE — Patient Instructions (Signed)
[facility], start taking it at bedtime for the next week then increase to twice a day.  You will follow-up with me in 2 weeks for further evaluation.  Continue working on the financial assistance application so we can begin the  referral process   Kennieth Rad, PA-C Physician Assistant [facility] [email or web address]  Gabapentin capsules or tablets What is this medicine? GABAPENTIN (GA ba pen tin) is used to control seizures in certain types of epilepsy. It is also used to treat certain types of nerve pain. This medicine may be used for other purposes; ask your health care provider or pharmacist if you have questions. COMMON BRAND NAME(S): Active-PAC with Gabapentin, Orpha Bur, Gralise, Neurontin What should I tell my health care provider before I take this medicine? They need to know if you have any of these conditions:  history of drug abuse or alcohol abuse problem  kidney disease  lung or breathing disease  suicidal thoughts, plans, or attempt; a previous suicide attempt by you or a family member  an unusual or allergic reaction to gabapentin, other medicines, foods, dyes, or preservatives  pregnant or trying to get pregnant  breast-feeding How should I use this medicine? Take this medicine by mouth with a glass of water. Follow the directions on the prescription label. You can take it with or without food. If it upsets your stomach, take it with food. Take your medicine at regular intervals. Do not take it more often than directed. Do not stop taking except on your doctor's advice. If you are directed to break the 600 or 800 mg tablets in half as part of your dose, the extra half tablet should be used for the next dose. If you have not used the extra half tablet within 28 days, it should be thrown away. A special MedGuide will be given to you by the pharmacist with each  prescription and refill. Be sure to read this information carefully each time. Talk to your pediatrician regarding the use of this medicine in children. While this drug may be prescribed for children as young as 3 years for selected conditions, precautions do apply. Overdosage: If you think you have taken too much of this medicine contact a poison control center or emergency room at once. NOTE: This medicine is only for you. Do not share this medicine with others. What if I miss a dose? If you miss a dose, take it as soon as you can. If it is almost time for your next dose, take only that dose. Do not take double or extra doses. What may interact with this medicine? This medicine may interact with the following medications:  alcohol  antihistamines for allergy, cough, and cold  certain medicines for anxiety or sleep  certain medicines for depression like amitriptyline, fluoxetine, sertraline  certain medicines for seizures like phenobarbital, primidone  certain medicines for stomach problems  general anesthetics like halothane, isoflurane, methoxyflurane, propofol  local anesthetics like lidocaine, pramoxine, tetracaine  medicines that relax muscles for surgery  narcotic medicines for pain  phenothiazines like chlorpromazine, mesoridazine, prochlorperazine, thioridazine This list may not describe all possible interactions. Give your health care provider a list of all the medicines, herbs, non-prescription drugs, or dietary supplements you use. Also tell them if you smoke, drink alcohol, or use illegal drugs. Some items may interact with your medicine. What should I watch for while using this medicine? Visit your doctor or health care provider for  regular checks on your progress. You may want to keep a record at home of how you feel your condition is responding to treatment. You may want to share this information with your doctor or health care provider at each visit. You should  contact your doctor or health care provider if your seizures get worse or if you have any new types of seizures. Do not stop taking this medicine or any of your seizure medicines unless instructed by your doctor or health care provider. Stopping your medicine suddenly can increase your seizures or their severity. This medicine may cause serious skin reactions. They can happen weeks to months after starting the medicine. Contact your health care provider right away if you notice fevers or flu-like symptoms with a rash. The rash may be red or purple and then turn into blisters or peeling of the skin. Or, you might notice a red rash with swelling of the face, lips or lymph nodes in your neck or under your arms. Wear a medical identification bracelet or chain if you are taking this medicine for seizures, and carry a card that lists all your medications. You may get drowsy, dizzy, or have blurred vision. Do not drive, use machinery, or do anything that needs mental alertness until you know how this medicine affects you. To reduce dizzy or fainting spells, do not sit or stand up quickly, especially if you are an older patient. Alcohol can increase drowsiness and dizziness. Avoid alcoholic drinks. Your mouth may get dry. Chewing sugarless gum or sucking hard candy, and drinking plenty of water will help. The use of this medicine may increase the chance of suicidal thoughts or actions. Pay special attention to how you are responding while on this medicine. Any worsening of mood, or thoughts of suicide or dying should be reported to your health care provider right away. Women who become pregnant while using this medicine may enroll in the [place] Pregnancy Registry by calling [phone]. This registry collects information about the safety of antiepileptic drug use during pregnancy. What side effects may I notice from receiving this medicine? Side effects that you should report to your  doctor or health care professional as soon as possible:  allergic reactions like skin rash, itching or hives, swelling of the face, lips, or tongue  breathing problems  rash, fever, and swollen lymph nodes  redness, blistering, peeling or loosening of the skin, including inside the mouth  suicidal thoughts, mood changes Side effects that usually do not require medical attention (report to your doctor or health care professional if they continue or are bothersome):  dizziness  drowsiness  headache  nausea, vomiting  swelling of ankles, feet, hands  tiredness This list may not describe all possible side effects. Call your doctor for medical advice about side effects. You may report side effects to FDA at 1-800-FDA-1088. Where should I keep my medicine? Keep out of reach of children. This medicine may cause accidental overdose and death if it taken by other adults, children, or pets. Mix any unused medicine with a substance like cat litter or coffee grounds. Then throw the medicine away in a sealed container like a sealed bag or a coffee can with a lid. Do not use the medicine after the expiration date. Store at room temperature between 15 and 30 degrees C (59 and 86 degrees F). NOTE: This sheet is a summary. It may not cover all possible information. If you have questions about this medicine, talk to your  doctor, pharmacist, or health care provider.  2021 Elsevier/Gold Standard (2018-11-06 14:16:43)

## 2020-09-14 NOTE — Progress Notes (Signed)
Patient has taken keppra today but has not eaten. Patient scaled eye pain at a 10 described as head and eye throbbing. Patient denies any seizure activity or falls since hospitalization. Patient is completing his financial to be able to have referral to neurology.

## 2020-09-19 ENCOUNTER — Ambulatory Visit: Payer: Self-pay | Admitting: Critical Care Medicine

## 2020-09-19 NOTE — Progress Notes (Deleted)
   Subjective:    Patient ID: Michael Hobbs, male    DOB: 1976/06/24, 45 y.o.   MRN: [id number]  44 y.o.[name] to est PCP  09/19/2020 Saw Dr Leanne Chang 08/10/20: Pain in left side of head above left eye.  Pain 10/10 constant Pain ongoing since October after he had a seizure and sustained a laceration to his left side of his head. He is not sure if the provider hit a nerve but has had pain since then.   Left eye is sensitive to light and wakes up with discharge on the eye in the morning.  October 5th sz at work. He has long hx of seizures. He says he hit head on October 5th, required stitches and now he continues to have pain around that area. He says that area feels numb and painful a th the same time.      .     Review of Systems     Objective:   Physical Exam        Assessment & Plan:

## 2020-09-21 ENCOUNTER — Other Ambulatory Visit: Payer: Self-pay

## 2020-09-21 ENCOUNTER — Encounter: Payer: Self-pay | Admitting: Critical Care Medicine

## 2020-09-21 ENCOUNTER — Other Ambulatory Visit: Payer: Self-pay | Admitting: Critical Care Medicine

## 2020-09-21 ENCOUNTER — Ambulatory Visit: Payer: Self-pay | Attending: Critical Care Medicine | Admitting: Critical Care Medicine

## 2020-09-21 VITALS — BP 153/102 | HR 94 | Resp 16 | Wt 171.0 lb

## 2020-09-21 DIAGNOSIS — I10 Essential (primary) hypertension: Secondary | ICD-10-CM | POA: Insufficient documentation

## 2020-09-21 DIAGNOSIS — F1020 Alcohol dependence, uncomplicated: Secondary | ICD-10-CM

## 2020-09-21 DIAGNOSIS — R569 Unspecified convulsions: Secondary | ICD-10-CM

## 2020-09-21 DIAGNOSIS — Z1159 Encounter for screening for other viral diseases: Secondary | ICD-10-CM

## 2020-09-21 DIAGNOSIS — S0512XD Contusion of eyeball and orbital tissues, left eye, subsequent encounter: Secondary | ICD-10-CM

## 2020-09-21 DIAGNOSIS — F10239 Alcohol dependence with withdrawal, unspecified: Secondary | ICD-10-CM

## 2020-09-21 DIAGNOSIS — F10939 Alcohol use, unspecified with withdrawal, unspecified: Secondary | ICD-10-CM

## 2020-09-21 DIAGNOSIS — D696 Thrombocytopenia, unspecified: Secondary | ICD-10-CM

## 2020-09-21 HISTORY — DX: Essential (primary) hypertension: I10

## 2020-09-21 HISTORY — DX: Thrombocytopenia, unspecified: D69.6

## 2020-09-21 MED ORDER — GABAPENTIN 300 MG PO CAPS: 300.0000 mg | ORAL_CAPSULE | Freq: Three times a day (TID) | ORAL | 1 refills | Status: DC

## 2020-09-21 MED ORDER — LEVETIRACETAM 500 MG PO TABS: 500.0000 mg | ORAL_TABLET | Freq: Two times a day (BID) | ORAL | 4 refills | Status: DC

## 2020-09-21 MED ORDER — THIAMINE HCL 100 MG PO TABS
100.0000 mg | ORAL_TABLET | Freq: Every day | ORAL | 1 refills | Status: DC
Start: 2020-09-21 — End: 2020-09-21

## 2020-09-21 MED ORDER — FOLIC ACID 1 MG PO TABS: 1.0000 mg | ORAL_TABLET | Freq: Every day | ORAL | 1 refills | Status: DC

## 2020-09-21 MED ORDER — VALSARTAN 160 MG PO TABS: 160.0000 mg | ORAL_TABLET | Freq: Every day | ORAL | 1 refills | Status: DC

## 2020-09-21 MED FILL — VALSARTAN 160 MG TABLET: 160 | 30 days supply | Qty: 30 | Fill #0

## 2020-09-21 MED FILL — GABAPENTIN 300 MG CAPSULE: 300 | 30 days supply | Qty: 90 | Fill #0

## 2020-09-21 NOTE — Assessment & Plan Note (Signed)
Exacerbated by alcohol use  Plan begin valsartan 160 mg daily  Check metabolic panel

## 2020-09-21 NOTE — Assessment & Plan Note (Signed)
Alcohol induced seizures  Continue Keppra  Refer back to neurology

## 2020-09-21 NOTE — Assessment & Plan Note (Signed)
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

## 2020-09-21 NOTE — Assessment & Plan Note (Signed)
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

## 2020-09-21 NOTE — Progress Notes (Signed)
Subjective:    Patient ID: Michael Hobbs, male    DOB: [birth year], 45 y.o.   MRN: [id number]  44 y.o.[name] to est PCP.  History of alcohol induced seizures severe alcoholism  Last seen by PA Mayers 09/14/20.  I had phone visit on 1/17 pt did not make that appt.   09/21/2020 This patient originally was to see me on January 17 but the office was closed due to the weather I attempted to do a telephone visit but we cannot connect that day.  Subsequent to that the patient has seen my physician assistant Prudence Davidson in recent times January 27 as noted below.  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  He had a severe bruise over the left eye significant periorbital edema and bleeding.  CT scan of the head and subsequent MRIs fails to show any internal bleeding in the brain.  Note this patient's previously had a fall with a subarachnoid hemorrhage related to a seizure related to alcohol use.  He routinely drinks about a pint of hard liquor and a 16 ounce beer daily with his cousin.  When he tries to cut back on his own this is when the seizures attempt to occur.  The patient has seen neurology with Dr. Delice Lesch in 2019 but has not been followed up since.  Patient is on chronic Keppra IV having been started on October 5.  Patient is needing refills of these medications.  He actually went to [place] for detox in March 2020 and then from there [facility] in [place] he was supposed to stay there a month but he signed  himself out AMA  Interestingly on several of the hospitalizations this patient has had he tends to sign himself out [place] early on in the process.  He does not have a mental health provider he is currently attached   Below are copies of documentations from recent provider visits 07/20/20 PA Prudence Davidson visit Michael Hobbs reports that he was seen in the  emergency room on May 23, 2020 after having a fall at work injuring his left eye.  07/17/20 nurse call  patient's mother called in to report increase pain patient's reported since last Tuesday after a fall from a seizure. Patient has been having seizures since Oct. 5 2021. Patient had another seizure within the past few weeks and fell and reinjured left eye. Left eye swollen and forehead swollen. Patient reports pain constant with burning sensation to left eye and forehead area. Patient reports he can not voluntarily open left eye and is required to open left eyelid with his fingers due to swelling. C/o light is painful and wears sunglasses if he has to go outside. Bilateral eyes water, no other drainage reported from eyes. Patient and mother requesting to get MRI. F/u appt scheduled for 08/09/20. No earlier appt noted. Encouraged patient to go to ED due to having another seizure and he fell and hit same eye as he did in October after 1st seizure. Care advise given. Patient verbalized understanding of care advise and to call back or go to [facility] or ED if symptoms worsen. Patient requesting to be notified if earlier OV available this week.   Note from ED visit May 23, 2020  Wound repaired by PA student under supervision of [place], [place]. There is a larger hematoma now post-repair with  some oozing of blood from the wound. I am concerned that there is a venous bleeder underneath that may still be oozing. I offered to take down his stitches, irrigate his wound and explore to identify a bleeding vessel but he declines, states he just wants to go home. Given standard wound care advise, advised that he may continue to ooze from his wound and to return to the ED if it does not stop. He would like resource guide for substance abuse treatment. Advised to stop drinking, no driving until seizure free for 6 months. PCP follow up.   Reports that he continued to have persistent and severe pain above his left eye  and presented to the emergency room again on July 10, 2020.  ED visit 07/10/20   patient with history of seizure disorder, alcohol abuse here for evaluation of progressive left forehead pain andperiocular pain following a seizure with hidden injury over one month ago. While awaiting ED evaluation patient did have a witness seizure. He had recurrent head injury with trauma to the area that was previously hurting. Confirmedwith mother that the hematoma to his left eyebrow is new following this new seizure event. No evidence of acute ocular injury. He does have a large hematoma, given his thrombocytopenia do not feel that drainage at this time would be beneficial to him as it will likely re-accumulate. Discussed with neurologist on-call, given his history of seizures will start on Keppra and give a Keppra load. Case management consulted for assistance with establishing follow-up. Patient does not appear to be in acute alcohol withdrawal at this time. Discussed with patient home care for seizures as well as hematoma. Discussed outpatient follow-up and return precautions.  Reports today that he continues to have disabling pain of his left eye, states that he has been using over-the-counter pain relievers and ice packs without much relief.  Reports that he has been taking the Keppra as directed, has not had any seizures since July 10, 2020.  Saw [facility] 08/10/20: Pain in left side of head above left eye.  Pain 10/10 constant Pain ongoing since October after he had a seizure and sustained a laceration to his left side of his head. He is not sure if the provider hit a nerve but has had pain since then.   Left eye is sensitive to light and wakes up with discharge on the eye in the morning.  October 5th sz at work. He has long hx of seizures. He says he hit head on October 5th, required stitches and now he continues to have pain around that area. He says that area feels  numb and painful a th the same time.   Patient continues to have discomfort.  I think what is happening is that he damaged the nerve serving the left periorbital area.  I think this is caused the equivalent of a neuritis.  I talked to him about this.  I also reviewed all of his recent imaging studies.  There is no concern on CT scanning or MRI findings.  I will treat with gabapentin 300 mg p.o. nightly for 1 week and then 300 mg p.o. twice daily I do not think he will need this for longer than 1 month.  1/27//22  [facility] visit: Michael Hobbs reports that he continues to have exquisite pain above his left eye radiating to the back of his head.  Reports that he may have 2 hours out of the day where his pain has subsided enough for him  to watch videos or TV. Reports that he has been scratching himself on the top of his head and wearing down bald spots from trying to release the pain.  Reports that he did start application for [place] financial assistance, unfortunately he became frustrated with the process and did not finish.  Reports that he did not start the gabapentin  Subarachnoid hematoma with loss of consciousness, sequela ([place]) Encourage patient to begin trial of gabapentin, continue with financial assistance application.  Patient to follow-up with mobile medicine unit in 2 weeks  Today 09/21/2020: Patient is still having pain in the eye it appears to be behind the he states the gabapentin helps a little bit but does not completely relieve it the pain got worse when the gabapentin dose was reduced to 300 mg daily Patient denies any fever Patient denies any recent seizures and he is taking the [place] On arrival blood pressure is 153/102 No prior documentations of hypertension however patient drinks every day a pint of hard liquor and a 16 ounce beer  He was very agitated today and did not want to stay to have his labs drawn he only want to have a quick visit note yesterday he came 30  minutes late we had to reschedule today.  The patient has transportation barriers and also issues with significant mental health is is obvious today    Past Medical History:  Diagnosis Date  . Alcohol abuse    fell in 2006 and had "brain bleed"  . HTN (hypertension) 09/21/2020  . Nasal fracture 12/01/2012  . PNA (pneumonia) 10/02/2018  . Seizures ([place])   . Subarachnoid hematoma ([place]) 12/01/2012  . Syncope 12/01/2012     Family History  Problem Relation Age of Onset  . Stroke Father      Social History   Socioeconomic History  . Marital status: Single    Spouse name: Not on file  . Number of children: Not on file  . Years of education: Not on file  . Highest education level: Not on file  Occupational History  . Not on file  Tobacco Use  . Smoking status: Current Every Day Smoker    Packs/day: 0.25    Types: Cigars  . Smokeless tobacco: Never Used  . Tobacco comment: unable to afford at this time  Substance and Sexual Activity  . Alcohol use: Yes    Comment: 1 pint of beer, and a 1/4 pint of liquor   . Drug use: No  . Sexual activity: Not Currently  Other Topics Concern  . 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 gabapentin (NEURONTIN) 300 MG capsule Take one pill at night for one week and then one pill two times daily. 60 capsule 0  . levETIRAcetam (KEPPRA) 500 MG tablet Take 1 tablet (500 mg total)  by mouth 2 (two) times daily.  60 tablet 0   No facility-administered medications prior to visit.      Review of Systems  Constitutional: Positive for fatigue.  HENT: Negative.   Eyes: Positive for pain and visual disturbance.  Respiratory: Negative.   Cardiovascular: Negative.   Gastrointestinal: Negative.   Genitourinary: Negative.   Musculoskeletal: Negative.   Neurological: Positive for dizziness, tremors, seizures, weakness, light-headedness and headaches. Negative for syncope and speech difficulty.  Hematological: Negative.   Psychiatric/Behavioral: Positive for agitation, behavioral problems, confusion, decreased concentration and hallucinations. Negative for self-injury and suicidal ideas. The patient is nervous/anxious and is hyperactive.        Objective:   Physical Exam Vitals:   09/21/20 1038  BP: (!) 153/102  Pulse: 94  Resp: 16  SpO2: 100%  Weight: 171 lb (77.6 kg)    Gen: Mildly agitated not willing to stay for the full exam  ENT: Mild bruising over the left supra orbital area on the skull which is rapidly improved compared to prior images full range of motion of the eyes no erythema or bleeding seen anywhere,  mouth clear,  oropharynx clear, no postnasal drip  Neck: No JVD, no TMG, no carotid bruits  Lungs: No use of accessory muscles, no dullness to percussion, clear without rales or rhonchi  Cardiovascular: RRR, heart sounds normal, no murmur or gallops, no peripheral edema  Abdomen: soft and NT, no HSM,  BS normal  Musculoskeletal: No deformities, no cyanosis or clubbing  Neuro: alert, non focal  Skin: Warm, no lesions or rashes  All imaging studies including the MRI below are reviewed in [facility] link MRI Brain 07/28/20:  FINDINGS: Brain: Dedicated thin section imaging through the temporal lobes demonstrates normal volume and signal of the hippocampi. There is no evidence of heterotopia or cortical dysplasia.  There is no evidence of  an acute infarct, intracranial hemorrhage, mass, midline shift, or extra-axial fluid collection. The ventricles and sulci are normal. The brain is normal in signal. No abnormal enhancement is identified.  Vascular: Major intracranial vascular flow voids are preserved.  Skull and upper cervical spine: Unremarkable bone marrow signal.  Sinuses/Orbits: Decreased size of left periorbital hematoma since the prior CT. Minimal mucosal thickening in the maxillary sinuses. Clear mastoid air cells.  Other: 28 x 6 mm frontal scalp lipoma unchanged from the prior MRI.  IMPRESSION: 1. Unremarkable appearance of the brain. 2. Decreased size of left periorbital hematoma.       Assessment & Plan:  I personally reviewed all images and lab data in the [facility] as well as any outside material available during this office visit and agree with the  radiology impressions.   Alcoholism ([place]) Severe chronic recurrent alcoholism Not significantly improving here  Patient failed rehab in 2020  Patient is not interested in returning to this or detoxing at this time he is very high risk for recurrent seizures depending on his daily alcohol intake  We will begin thiamine folic acid check thiamine levels B12 levels Check liver function other lab data Refill [place] this patient with clinical social work for alcohol treatment services Left before his labs to be drawn he states he will come back Return in any case short-term to the office for exam  Patients is working on financial assistance we will then send back to neurology  Seizures ([place]) Alcohol induced seizures  Continue Keppra  Refer back to neurology  Periorbital contusion of left eye 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  Diagnoses and all orders for this visit:  Alcohol withdrawal seizure with complication (HCC) -     levETIRAcetam (KEPPRA) 500 MG tablet; Take 1 tablet (500 mg total) by mouth 2 (two) times daily. -     Comprehensive metabolic panel -     CBC with Differential/Platelet -     Vitamin B12 -     Vitamin B1 -     Ambulatory referral to Neurology  Alcoholism [facility]) -     Comprehensive metabolic panel -     CBC with Differential/Platelet -     Vitamin B12 -     Vitamin B1 -     Ambulatory referral to Neurology  Need for hepatitis C screening test -     HCV Ab w Reflex to Quant PCR  Periorbital contusion of left eye, subsequent encounter  Primary hypertension  Thrombocytopenia ([place])  Other orders -     gabapentin (NEURONTIN) 300 MG capsule; Take 1 capsule (300 mg total) by mouth 3 (three) times daily. Take one pill at night for one week and then one pill two times daily. -     thiamine 100 MG tablet; Take 1 tablet (100 mg total) by mouth daily. -     folic acid (FOLVITE) 1 MG tablet; Take 1 tablet (1 mg total) by mouth daily. -     valsartan (DIOVAN) 160 MG tablet; Take 1 tablet (160 mg total) by mouth daily.   I spent an hour going over this patient's entire medical record reviewing all doctor notes imaging studies and interviewing the patient formulating this plan and developing this note

## 2020-09-21 NOTE — Patient Instructions (Signed)
Begin Thiamine and folic acid daily  Continue keppra twice daily  Increase gabapentin to three times a day  Start valsartan 160mg  daily  Please return for lab draws.  Referral to dr of neurology made  Return Dr Karel Jarvis 6 weeks   Alcohol Abuse and Dependence Information, Adult Alcohol is a widely available drug. People drink alcohol in different amounts. People who drink alcohol very often and in large amounts often have problems during and after drinking. They may develop what is called an alcohol use disorder. There are two main types of alcohol use disorders:  Alcohol abuse. This is when you use alcohol too much or too often. You may use alcohol to make yourself feel happy or to reduce stress. You may have a hard time setting a limit on the amount you drink.  Alcohol dependence. This is when you use alcohol consistently for a period of time, and your body changes as a result. This can make it hard to stop drinking because you may start to feel sick or feel different when you do not use alcohol. These symptoms are known as withdrawal. How can alcohol abuse and dependence affect me? Alcohol abuse and dependence can have a negative effect on your life. Drinking too much can lead to addiction. You may feel like you need alcohol to function normally. You may drink alcohol before work in the morning, during the day, or as soon as you get home from work in the evening. These actions can result in:  Poor work performance.  Job loss.  Financial problems.  Car crashes or criminal charges from driving after drinking alcohol.  Problems in your relationships with friends and family.  Losing the trust and respect of coworkers, friends, and family. Drinking heavily over a long period of time can permanently damage your body and brain, and can cause lifelong health issues, such as:  Damage to your liver or pancreas.  Heart problems, high blood pressure, or stroke.  Certain  cancers.  Decreased ability to fight infections.  Brain or nerve damage.  Depression.  Early (premature) death. If you are careless or you crave alcohol, it is easy to drink more than your body can handle (overdose). Alcohol overdose is a serious situation that requires hospitalization. It may lead to permanent injuries or death. What can increase my risk?  Having a family history of alcohol abuse.  Having depression or other mental health conditions.  Beginning to drink at an early age.  Binge drinking often.  Experiencing trauma, stress, and an unstable home life during childhood.  Spending time with people who drink often. What actions can I take to prevent or manage alcohol abuse and dependence?  Do not drink alcohol if: ? Your health care provider tells you not to drink. ? You are pregnant, may be pregnant, or are planning to become pregnant.  If you drink alcohol: ? Limit how much you use to:  0-1 drink a day for women.  0-2 drinks a day for men. ? Be aware of how much alcohol is in your drink. In the [place] one drink equals one 12 oz bottle of beer (355 mL), one 5 oz glass of wine (148 mL), or one 1 oz glass of hard liquor (44 mL).  Stop drinking if you have been drinking too much. This can be very hard to do if you are used to abusing alcohol. If you begin to have withdrawal symptoms, talk with your health care provider or a person that  you trust. These symptoms may include anxiety, shaky hands, headache, nausea, sweating, or not being able to sleep.  Choose to drink nonalcoholic beverages in social gatherings and places where there may be alcohol. Activity  Spend more time on activities that you enjoy that do not involve alcohol, like hobbies or exercise.  Find healthy ways to cope with stress, such as exercise, meditation, or spending time with people you care about. General information  Talk to your family, coworkers, and friends about supporting you in your  efforts to stop drinking. If they drink, ask them not to drink around you. Spend more time with people who do not drink alcohol.  If you think that you have an alcohol dependency problem: ? Tell friends or family about your concerns. ? Talk with your health care provider or another health professional about where to get help. ? Work with a Transport planner and a Regulatory affairs officer. ? Consider joining a support group for people who struggle with alcohol abuse and dependence. Where to find support  Your health care provider.  SMART Recovery: www.smartrecovery.org Therapy and support groups  Local treatment centers or chemical dependency counselors.  Local AA groups in your community: [place].com.pt   Where to find more information  Centers for Disease Control and Prevention: http://www.wolf.info/  National Institute on Alcohol Abuse and Alcoholism: http://www.bradshaw.com/  Alcoholics Anonymous (AA): NicTax.com.pt Contact a health care provider if:  You drank more or for longer than you intended on more than one occasion.  You tried to stop drinking or to cut back on how much you drink, but you were not able to.  You often drink to the point of vomiting or passing out.  You want to drink so badly that you cannot think about anything else.  You have problems in your life due to drinking, but you continue to drink.  You keep drinking even though you feel anxious, depressed, or have experienced memory loss.  You have stopped doing the things you used to enjoy in order to drink.  You have to drink more than you used to in order to get the effect you want.  You experience anxiety, sweating, nausea, shakiness, and trouble sleeping when you try to stop drinking. Get help right away if:  You have thoughts about hurting yourself or others.  You have serious withdrawal symptoms, including: ? Confusion. ? Racing heart. ? High blood pressure. ? Fever. If you ever feel like you may hurt yourself or  others, or have thoughts about taking your own life, get help right away. You can go to your nearest emergency department or call:  Your local emergency services (911 in the [place]  A suicide crisis helpline, such as the [place] at 5081124910. This is open 24 hours a day. Summary  Alcohol abuse and dependence can have a negative effect on your life. Drinking too much or too often can lead to addiction.  If you drink alcohol, limit how much you use.  If you are having trouble keeping your drinking under control, find ways to change your behavior. Hobbies, calming activities, exercise, or support groups can help.  If you feel you need help with changing your drinking habits, talk with your health care provider, a good friend, or a therapist, or go to an [place] group. This information is not intended to replace advice given to you by your health care provider. Make sure you discuss any questions you have with your health care provider. Document Revised: 11/24/2018  Document Reviewed: 10/13/2018 Elsevier Patient Education  [place].

## 2020-09-21 NOTE — Assessment & Plan Note (Signed)
Severe chronic recurrent alcoholism Not significantly improving here  Patient failed rehab in 2020  Patient is not interested in returning to this or detoxing at this time he is very high risk for recurrent seizures depending on his daily alcohol intake  We will begin thiamine folic acid check thiamine levels B12 levels Check liver function other lab data Refill [place] this patient with clinical social work for alcohol treatment services Left before his labs to be drawn he states he will come back Return in any case short-term to the office for exam  Patients is working on financial assistance we will then send back to neurology

## 2020-09-21 NOTE — Progress Notes (Signed)
Eye pain 6/10   Would like referral to Neurologist. In the process to apply for orange card.

## 2020-09-27 ENCOUNTER — Ambulatory Visit: Payer: Self-pay | Admitting: Licensed Clinical Social Worker

## 2020-09-27 ENCOUNTER — Telehealth: Payer: Self-pay | Admitting: Licensed Clinical Social Worker

## 2020-09-27 NOTE — Telephone Encounter (Signed)
Call placed to patient regarding scheduled [facility] appointment. LCSW left message requesting a return call.  

## 2020-09-28 ENCOUNTER — Encounter: Payer: Self-pay | Admitting: Physician Assistant

## 2020-09-28 ENCOUNTER — Ambulatory Visit: Payer: Self-pay

## 2020-09-28 ENCOUNTER — Ambulatory Visit (INDEPENDENT_AMBULATORY_CARE_PROVIDER_SITE_OTHER): Payer: Self-pay | Admitting: Physician Assistant

## 2020-09-28 ENCOUNTER — Other Ambulatory Visit: Payer: Self-pay

## 2020-09-28 VITALS — BP 157/108 | HR 104 | Temp 98.2°F | Resp 18 | Ht 72.0 in | Wt 171.0 lb

## 2020-09-28 DIAGNOSIS — I10 Essential (primary) hypertension: Secondary | ICD-10-CM

## 2020-09-28 DIAGNOSIS — S066X9S Traumatic subarachnoid hemorrhage with loss of consciousness of unspecified duration, sequela: Secondary | ICD-10-CM

## 2020-09-28 DIAGNOSIS — R569 Unspecified convulsions: Secondary | ICD-10-CM

## 2020-09-28 NOTE — Progress Notes (Signed)
Patient took his medication 1hr ago and has not eaten today. Patient reports pain at a 1 and feeling the best he has since the hospitalization. Patient reports he did slip on ice 2 days ago and is sore in his coccyx area.

## 2020-09-28 NOTE — Patient Instructions (Signed)
I encourage you to continue taking the gabapentin 3 times a day, and start the medications that were prescribed by Dr. Joya Gaskins.  Please let us know if there is anything else we can do for you before your next visit with Dr. Joya Gaskins  I am thankful that the gabapentin is offering relief from your pain.  Kennieth Rad, PA-C Physician Assistant [facility] [email or web address]    Health Maintenance, Male Adopting a healthy lifestyle and getting preventive care are important in promoting health and wellness. Ask your health care provider about:  The right schedule for you to have regular tests and exams.  Things you can do on your own to prevent diseases and keep yourself healthy. What should I know about diet, weight, and exercise? Eat a healthy diet  Eat a diet that includes plenty of vegetables, fruits, low-fat dairy products, and lean protein.  Do not eat a lot of foods that are high in solid fats, added sugars, or sodium.   Maintain a healthy weight Body mass index (BMI) is a measurement that can be used to identify possible weight problems. It estimates body fat based on height and weight. Your health care provider can help determine your BMI and help you achieve or maintain a healthy weight. Get regular exercise Get regular exercise. This is one of the most important things you can do for your health. Most adults should:  Exercise for at least 150 minutes each week. The exercise should increase your heart rate and make you sweat (moderate-intensity exercise).  Do strengthening exercises at least twice a week. This is in addition to the moderate-intensity exercise.  Spend less time sitting. Even light physical activity can be beneficial. Watch cholesterol and blood lipids Have your blood tested for lipids and cholesterol at 44 years of age, then have this test every 5 years. You may need to have your cholesterol levels checked  more often if:  Your lipid or cholesterol levels are high.  You are older than 44 years of age.  You are at high risk for heart disease. What should I know about cancer screening? Many types of cancers can be detected early and may often be prevented. Depending on your health history and family history, you may need to have cancer screening at various ages. This may include screening for:  Colorectal cancer.  Prostate cancer.  Skin cancer.  Lung cancer. What should I know about heart disease, diabetes, and high blood pressure? Blood pressure and heart disease  High blood pressure causes heart disease and increases the risk of stroke. This is more likely to develop in people who have high blood pressure readings, are of African descent, or are overweight.  Talk with your health care provider about your target blood pressure readings.  Have your blood pressure checked: ? Every 3-5 years if you are 57-29 years of age. ? Every year if you are 49 years old or older.  If you are between the ages of 65 and 55 and are a current or former smoker, ask your health care provider if you should have a one-time screening for abdominal aortic aneurysm (AAA). Diabetes Have regular diabetes screenings. This checks your fasting blood sugar level. Have the screening done:  Once every three years after age 81 if you are at a normal weight and have a low risk for diabetes.  More often and at a younger age if you are overweight or have a high risk for diabetes. What should I know  about preventing infection? Hepatitis B If you have a higher risk for hepatitis B, you should be screened for this virus. Talk with your health care provider to find out if you are at risk for hepatitis B infection. Hepatitis C Blood testing is recommended for:  Everyone born from [birth year] through 1964.  Anyone with known risk factors for hepatitis C. Sexually transmitted infections (STIs)  You should be screened each  year for STIs, including gonorrhea and chlamydia, if: ? You are sexually active and are younger than 44 years of age. ? You are older than 44 years of age and your health care provider tells you that you are at risk for this type of infection. ? Your sexual activity has changed since you were last screened, and you are at increased risk for chlamydia or gonorrhea. Ask your health care provider if you are at risk.  Ask your health care provider about whether you are at high risk for HIV. Your health care provider may recommend a prescription medicine to help prevent HIV infection. If you choose to take medicine to prevent HIV, you should first get tested for HIV. You should then be tested every 3 months for as long as you are taking the medicine. Follow these instructions at home: Lifestyle  Do not use any products that contain nicotine or tobacco, such as cigarettes, e-cigarettes, and chewing tobacco. If you need help quitting, ask your health care provider.  Do not use street drugs.  Do not share needles.  Ask your health care provider for help if you need support or information about quitting drugs. Alcohol use  Do not drink alcohol if your health care provider tells you not to drink.  If you drink alcohol: ? Limit how much you have to 0-2 drinks a day. ? Be aware of how much alcohol is in your drink. In the [place] one drink equals one 12 oz bottle of beer (355 mL), one 5 oz glass of wine (148 mL), or one 1 oz glass of hard liquor (44 mL). General instructions  Schedule regular health, dental, and eye exams.  Stay current with your vaccines.  Tell your health care provider if: ? You often feel depressed. ? You have ever been abused or do not feel safe at home. Summary  Adopting a healthy lifestyle and getting preventive care are important in promoting health and wellness.  Follow your health care provider's instructions about healthy diet, exercising, and getting tested or  screened for diseases.  Follow your health care provider's instructions on monitoring your cholesterol and blood pressure. This information is not intended to replace advice given to you by your health care provider. Make sure you discuss any questions you have with your health care provider. Document Revised: 07/29/2018 Document Reviewed: 07/29/2018 Elsevier Patient Education  2021 [employer].

## 2020-09-28 NOTE — Progress Notes (Signed)
Established Patient Office Visit  Subjective:  Patient ID: Michael Hobbs, male    DOB: [birth year]  Age: 45 y.o. MRN: [id number]  CC:  Chief Complaint  Patient presents with  . Follow-up    Left eye pain    HPI Michael Hobbs reports that he did start taking the gabapentin, and it was increased by Dr. Joya Gaskins during their last office visit.  Reports he has been compliant to taking it 3 times a day with great relief.  Reports pain level as 1.  Reports that he has not started taking the valsartan or anxiety thiamine and folic acid that was prescribed at his last visit with Dr. Joya Gaskins.  Reports that he plans on picking the valsartan up today, states that he does not want to start the thiamine or folic acid.     Past Medical History:  Diagnosis Date  . Alcohol abuse    fell in 2006 and had "brain bleed"  . HTN (hypertension) 09/21/2020  . Nasal fracture 12/01/2012  . PNA (pneumonia) 10/02/2018  . Seizures ([place])   . Subarachnoid hematoma ([place]) 12/01/2012  . Syncope 12/01/2012    Past Surgical History:  Procedure Laterality Date  . HERNIA REPAIR    . right shoulder surgery.      Family History  Problem Relation Age of Onset  . Stroke Father     Social History   Socioeconomic History  . Marital status: Single    Spouse name: Not on file  . Number of children: Not on file  . Years of education: Not on file  . Highest education level: Not on file  Occupational History  . Not on file  Tobacco Use  . Smoking status: Current Every Day Smoker    Packs/day: 0.25    Types: Cigars  . Smokeless tobacco: Never Used  . Tobacco comment: unable to afford at this time  Substance and Sexual Activity  . Alcohol use: Yes    Comment: 1 pint of beer, and a 1/4 pint of liquor   . Drug use: No  . Sexual activity: Not Currently  Other Topics Concern  . 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 folic acid (FOLVITE) 1 MG tablet Take 1 tablet (1 mg total) by mouth daily. 60 tablet 1  . gabapentin (NEURONTIN) 300 MG capsule Take 1 capsule (300 mg total) by mouth 3 (three) times daily. Take one pill at night for one week and then one pill two times daily. 90 capsule 1  . levETIRAcetam (KEPPRA) 500 MG tablet Take 1 tablet (500 mg total) by mouth 2 (two) times daily. 60 tablet 4  . thiamine 100 MG tablet Take 1 tablet (100 mg total) by mouth daily. 60 tablet 1  . valsartan (DIOVAN) 160 MG tablet Take 1 tablet (160 mg total) by mouth daily. 90 tablet 1   No facility-administered  medications prior to visit.    No Known Allergies  ROS Review of Systems  Constitutional: Negative.   HENT: Negative.   Eyes: Positive for pain.  Respiratory: Negative.   Cardiovascular: Negative for chest pain.  Gastrointestinal: Negative.   Endocrine: Negative.   Genitourinary: Negative.   Musculoskeletal: Negative.   Skin: Negative.   Allergic/Immunologic: Negative.   Neurological: Negative.   Hematological: Negative.   Psychiatric/Behavioral: Negative.       Objective:    Physical Exam Constitutional:      General: He is not in acute distress.    Appearance: Normal appearance. He is not ill-appearing.  HENT:     Head: Normocephalic and atraumatic.     Right Ear: External ear normal.     Left Ear: External ear normal.     Nose: Nose normal.     Mouth/Throat:     Mouth: Mucous membranes are moist.     Pharynx: Oropharynx is  clear.  Eyes:   Cardiovascular:     Rate and Rhythm: Normal rate and regular rhythm.     Pulses: Normal pulses.     Heart sounds: Normal heart sounds.  Musculoskeletal:        General: Normal range of motion.     Cervical back: Normal range of motion and neck supple.  Skin:    General: Skin is warm.  Neurological:     General: No focal deficit present.     Mental Status: He is alert and oriented to person, place, and time.  Psychiatric:        Mood and Affect: Mood normal.        Behavior: Behavior normal.        Thought Content: Thought content normal.        Judgment: Judgment normal.     BP (!) 157/108 (BP Location: Left Arm, Patient Position: Sitting, Cuff Size: Normal)   Pulse (!) 104   Temp 98.2 F (36.8 C) (Temporal)   Resp 18   Ht 6' (1.829 m)   Wt 171 lb (77.6 kg)   SpO2 97%   BMI 23.19 kg/m  Wt Readings from Last 3 Encounters:  09/28/20 171 lb (77.6 kg)  09/21/20 171 lb (77.6 kg)  09/14/20 172 lb (78 kg)     Health Maintenance Due  Topic Date Due  . Hepatitis C Screening  Never done  . TETANUS/TDAP  Never done    There are no preventive care reminders to display for this patient.  No results found for: TSH Lab Results  Component Value Date   WBC 4.5 07/10/2020   HGB 13.5 07/10/2020   HCT 40.6 07/10/2020   MCV 109.4 (H) 07/10/2020   PLT 71 (L) 07/10/2020   Lab Results  Component Value Date   NA 141 07/21/2020   K 4.1 07/21/2020   CO2 24 07/21/2020   GLUCOSE 77 07/21/2020   BUN 7 07/21/2020   CREATININE 0.77 07/21/2020   BILITOT 0.9 07/10/2020   ALKPHOS 44 07/10/2020   AST 64 (H) 07/10/2020   ALT 24 07/10/2020   PROT 7.7 07/10/2020   ALBUMIN 4.5 07/10/2020   CALCIUM 9.2 07/21/2020   ANIONGAP 18 (H) 07/10/2020   Lab Results  Component Value Date   CHOL 213 (H) 07/21/2020   Lab Results  Component Value Date   HDL 61 07/21/2020   Lab Results  Component Value Date   LDLCALC 130 (H) 07/21/2020   Lab Results  Component Value  Date   TRIG  126 07/21/2020   Lab Results  Component Value Date   CHOLHDL 3.5 07/21/2020   No results found for: HGBA1C    Assessment & Plan:   Problem List Items Addressed This Visit      Cardiovascular and Mediastinum   HTN (hypertension)     Other   Subarachnoid hematoma ([place]) - Primary   Seizures ([place])    1. Subarachnoid hematoma with loss of consciousness, sequela (HCC) Continue current regimen.  Patient strongly encouraged to start complete regimen as prescribed.  Patient has transportation difficulties, encouraged patient to schedule next appointment for first thing in the morning so he is able to complete labs and complete his office visit.  Patient was unable to complete labs today due to transportation  2. Primary hypertension   3. Seizures ([place])   I have reviewed the patient's medical history (PMH, PSH, Social History, Family History, Medications, and allergies) , and have been updated if relevant. I spent 20 minutes reviewing chart and  face to face time with patient.      No orders of the defined types were placed in this encounter.   Follow-up: Return if symptoms worsen or fail to improve.    Loraine Grip Alyssia Heese, PA-C

## 2020-11-01 ENCOUNTER — Other Ambulatory Visit: Payer: Self-pay | Admitting: Neurology

## 2020-11-01 ENCOUNTER — Encounter: Payer: Self-pay | Admitting: Neurology

## 2020-11-01 ENCOUNTER — Other Ambulatory Visit: Payer: Self-pay

## 2020-11-01 ENCOUNTER — Ambulatory Visit (INDEPENDENT_AMBULATORY_CARE_PROVIDER_SITE_OTHER): Payer: Self-pay | Admitting: Neurology

## 2020-11-01 VITALS — BP 130/88 | HR 97 | Ht 72.0 in | Wt 168.6 lb

## 2020-11-01 DIAGNOSIS — F10239 Alcohol dependence with withdrawal, unspecified: Secondary | ICD-10-CM

## 2020-11-01 DIAGNOSIS — R569 Unspecified convulsions: Secondary | ICD-10-CM

## 2020-11-01 DIAGNOSIS — M792 Neuralgia and neuritis, unspecified: Secondary | ICD-10-CM

## 2020-11-01 DIAGNOSIS — G40009 Localization-related (focal) (partial) idiopathic epilepsy and epileptic syndromes with seizures of localized onset, not intractable, without status epilepticus: Secondary | ICD-10-CM

## 2020-11-01 DIAGNOSIS — S0512XS Contusion of eyeball and orbital tissues, left eye, sequela: Secondary | ICD-10-CM

## 2020-11-01 MED ORDER — LEVETIRACETAM 500 MG PO TABS: 500.0000 mg | ORAL_TABLET | Freq: Two times a day (BID) | ORAL | 11 refills | Status: DC

## 2020-11-01 MED ORDER — GABAPENTIN 600 MG PO TABS: 600.0000 mg | ORAL_TABLET | Freq: Two times a day (BID) | ORAL | 11 refills | Status: DC

## 2020-11-01 NOTE — Patient Instructions (Signed)
1. Increase Gabapentin: with your current prescription of gabapentin 300mg : take 1 tab in AM, 2 tabs in PM for 1 week, then increase to 2 tablets twice a day. Once done, your new prescription will be for Gabapentin 600mg : Take 1 tablet twice a day  2. Continue Keppra 500mg  twice a day  3. Will ask Dr. Joya Gaskins about ophthalomology and plastic surgery referral  4. Follow-up in 6 months, call for any changes   Seizure Precautions: 1. If medication has been prescribed for you to prevent seizures, take it exactly as directed.  Do not stop taking the medicine without talking to your doctor first, even if you have not had a seizure in a long time.   2. Avoid activities in which a seizure would cause danger to yourself or to others.  Don't operate dangerous machinery, swim alone, or climb in high or dangerous places, such as on ladders, roofs, or girders.  Do not drive unless your doctor says you may.  3. If you have any warning that you may have a seizure, lay down in a safe place where you can't hurt yourself.    4.  No driving for 6 months from last seizure, as per [facility].   Please refer to the following link on the [facility] website for more information: [email or web address]   5.  Maintain good sleep hygiene. Avoid alcohol   6.  Contact your doctor if you have any problems that may be related to the medicine you are taking.  7.  Call 911 and bring the patient back to the ED if:        A.  The seizure lasts longer than 5 minutes.       B.  The patient doesn't awaken shortly after the seizure  C.  The patient has new problems such as difficulty seeing, speaking or moving  D.  The patient was injured during the seizure  E.  The patient has a temperature over 102 F (39C)  F.  The patient vomited and now is having trouble breathing
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
## 2020-11-02 ENCOUNTER — Telehealth: Payer: Self-pay | Admitting: Critical Care Medicine

## 2020-11-02 NOTE — Telephone Encounter (Signed)
Pt states that he does not need any medications.

## 2020-11-02 NOTE — Telephone Encounter (Signed)
Copied from [place] [id number]. Topic: General - Other >> Oct 31, 2020 11:53 AM Tessa Lerner A wrote: Reason for CRM: Patient would like to be contacted by a member of staff regarding medication  Patient was offered a prescription for a medication to help with his blood pressure "that began with the letter a" but declined the prescription for personal reasons  Patient is now requesting to be prescribed the medication  Patient declined to set up an appt with PCP at the time of call with agent  Please contact to assist further

## 2020-11-07 ENCOUNTER — Encounter (INDEPENDENT_AMBULATORY_CARE_PROVIDER_SITE_OTHER): Payer: Self-pay | Admitting: Family Medicine

## 2020-11-07 ENCOUNTER — Other Ambulatory Visit: Payer: Self-pay

## 2020-11-07 NOTE — Progress Notes (Signed)
Pain in left eye Feels like its energy in there- feels electric Was going to get a beer to take the pain away

## 2020-11-07 NOTE — Progress Notes (Signed)
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 Visit did not occur. No charge

## 2020-11-15 ENCOUNTER — Ambulatory Visit: Payer: Self-pay | Admitting: *Deleted

## 2020-11-15 NOTE — Telephone Encounter (Signed)
Will forward to covering provider.

## 2020-11-15 NOTE — Telephone Encounter (Signed)
Both medications are prescribed by his Neurologist. Please advise him to contact her.

## 2020-11-15 NOTE — Telephone Encounter (Signed)
The pt states he takes his keppra and gabapentin with water and then he vomits; the pt says he vomits 30 minutes later and he has a nose bleed; this has occurred twice in the past 2 days; he has no changes in his diet; the nosebleeds occurs after he coughs and vomits; the pt says his gabapentin dose was changed on 11/01/20; recommendations made per nurse triage protocol; he verbalized understanding; the pt sees Dr Asencion Noble at [facility] [place]; there is no availability within the suggested timeframe; will route to office for scheduling; the pt can be contacted at [phone].  Reason for Disposition . [1] MILD or MODERATE vomiting AND [2] present > 48 hours (2 days) (Exception: mild vomiting with associated diarrhea)  Answer Assessment - Initial Assessment Questions 1. VOMITING SEVERITY: "How many times have you vomited in the past 24 hours?"     - MILD:  1 - 2 times/day    - MODERATE: 3 - 5 times/day, decreased oral intake without significant weight loss or symptoms of dehydration    - SEVERE: 6 or more times/day, vomits everything or nearly everything, with significant weight loss, symptoms of dehydration     Once daily on 2/27 and 2/28 2. ONSET: "When did the vomiting begin?"     2/27-2/28 3. FLUIDS: "What fluids or food have you vomited up today?" "Have you been able to keep any fluids down?"   no 4. ABDOMINAL PAIN: "Are your having any abdominal pain?" If yes : "How bad is it and what does it feel like?" (e.g., crampy, dull, intermittent, constant)    no 5. DIARRHEA: "Is there any diarrhea?" If Yes, ask: "How many times today?"      Diarrhea since 11/13/20; 4 episodes on 3/29 6. CONTACTS: "Is there anyone else in the family with the same symptoms?"      no 7. CAUSE: "What do you think is causing your vomiting?"    Not sure 8. HYDRATION STATUS: "Any signs of dehydration?" (e.g., dry mouth [not only dry lips], too weak to stand) "When did you last urinate?"    no 9. OTHER  SYMPTOMS: "Do you have any other symptoms?" (e.g., fever, headache, vertigo, vomiting blood or coffee grounds, recent head injury)    Nose bleeds 10. PREGNANCY: "Is there any chance you are pregnant?" "When was your last menstrual period?"      n/a  Protocols used: [facility]

## 2020-11-16 NOTE — Telephone Encounter (Signed)
Returned pt call and went over Dr. Margarita Rana response pt states he understands

## 2020-11-17 ENCOUNTER — Ambulatory Visit: Payer: Self-pay

## 2020-11-17 DIAGNOSIS — H538 Other visual disturbances: Secondary | ICD-10-CM

## 2020-11-17 NOTE — Telephone Encounter (Signed)
Will forward to covering provider.

## 2020-11-17 NOTE — Telephone Encounter (Addendum)
  Pt. States he has had blurred vision since October 2021."Mainly my left eye.I had to quit my States his neurologist was going to refer him to an "eye doctor but I still haven't seen one." Declines appointment. Asking if Dr. Joya Gaskins can refer him to an eye doctor. Please  Advise pt. Answer Assessment - Initial Assessment Questions 1. DESCRIPTION: "What is the vision loss like? Describe it for me." (e.g., complete vision loss, blurred vision, double vision, floaters, etc.)     Burred vision 2. LOCATION: "One or both eyes?" If one, ask: "Which eye?"     Left 3. SEVERITY: "Can you see anything?" If Yes, ask: "What can you see?" (e.g., fine print)     Can't see fine print 4. ONSET: "When did this begin?" "Did it start suddenly or has this been gradual?"     October 2021 5. PATTERN: "Does this come and go, or has it been constant since it started?"     Constant 6. PAIN: "Is there any pain in your eye(s)?"  (Scale 1-10; or mild, moderate, severe)     Yes -  7. CONTACTS-GLASSES: "Do you wear contacts or glasses?"     No 8. CAUSE: "What do you think is causing this visual problem?"     Unsure 9. OTHER SYMPTOMS: "Do you have any other symptoms?" (e.g., confusion, headache, arm or leg weakness, speech problems)     No 10. PREGNANCY: "Is there any chance you are pregnant?" "When was your last menstrual period?"       n/a  Protocols used: [facility]

## 2020-11-20 NOTE — Addendum Note (Signed)
Addended by: Charlott Rakes on: 11/20/2020 10:28 AM   Modules accepted: Orders

## 2020-11-20 NOTE — Telephone Encounter (Signed)
Referral has been placed. 

## 2020-11-27 ENCOUNTER — Ambulatory Visit: Payer: Self-pay

## 2020-11-27 NOTE — Telephone Encounter (Signed)
Pt. States he fell in October 2021 and hit his head. Has had pain ever since then and has seen a neurologist. Pain is in front left side of his head. Gabapentin "does not help at all." Requesting appointment with his PCP "soon if possible." No answer in the practice.Please advise pt.   Answer Assessment - Initial Assessment Questions 1. LOCATION: "Where does it hurt?"      Front on the left 2. ONSET: "When did the headache start?" (Minutes, hours or days)      October 2021 3. PATTERN: "Does the pain come and go, or has it been constant since it started?"     Constant 4. SEVERITY: "How bad is the pain?" and "What does it keep you from doing?"  (e.g., Scale 1-10; mild, moderate, or severe)   - MILD (1-3): doesn't interfere with normal activities    - MODERATE (4-7): interferes with normal activities or awakens from sleep    - SEVERE (8-10): excruciating pain, unable to do any normal activities        10 5. RECURRENT SYMPTOM: "Have you ever had headaches before?" If Yes, ask: "When was the last time?" and "What happened that time?"      Yes 6. CAUSE: "What do you think is causing the headache?"     Fell in October and hit forehead 7. MIGRAINE: "Have you been diagnosed with migraine headaches?" If Yes, ask: "Is this headache similar?"      No 8. HEAD INJURY: "Has there been any recent injury to the head?"      Yes- October 2021 9. OTHER SYMPTOMS: "Do you have any other symptoms?" (fever, stiff neck, eye pain, sore throat, cold symptoms)     Eyes hurt - bright light hurts eyes 10. PREGNANCY: "Is there any chance you are pregnant?" "When was your last menstrual period?"       N/A  Protocols used: [facility]

## 2020-11-27 NOTE — Telephone Encounter (Signed)
Patient has been scheduled with Dr. Joya Gaskins 4/14 at 8:30am

## 2020-11-30 ENCOUNTER — Telehealth: Payer: Self-pay | Admitting: *Deleted

## 2020-11-30 ENCOUNTER — Ambulatory Visit: Payer: Self-pay | Attending: Critical Care Medicine | Admitting: Critical Care Medicine

## 2020-11-30 ENCOUNTER — Other Ambulatory Visit: Payer: Self-pay

## 2020-11-30 ENCOUNTER — Encounter: Payer: Self-pay | Admitting: Critical Care Medicine

## 2020-11-30 VITALS — BP 123/86 | HR 88 | Ht 71.0 in | Wt 164.2 lb

## 2020-11-30 DIAGNOSIS — I10 Essential (primary) hypertension: Secondary | ICD-10-CM

## 2020-11-30 DIAGNOSIS — S066X9S Traumatic subarachnoid hemorrhage with loss of consciousness of unspecified duration, sequela: Secondary | ICD-10-CM

## 2020-11-30 DIAGNOSIS — S0512XS Contusion of eyeball and orbital tissues, left eye, sequela: Secondary | ICD-10-CM

## 2020-11-30 DIAGNOSIS — F1020 Alcohol dependence, uncomplicated: Secondary | ICD-10-CM

## 2020-11-30 DIAGNOSIS — F4310 Post-traumatic stress disorder, unspecified: Secondary | ICD-10-CM | POA: Insufficient documentation

## 2020-11-30 DIAGNOSIS — S058X2D Other injuries of left eye and orbit, subsequent encounter: Secondary | ICD-10-CM

## 2020-11-30 DIAGNOSIS — R569 Unspecified convulsions: Secondary | ICD-10-CM

## 2020-11-30 NOTE — Assessment & Plan Note (Signed)
Periorbital contusion of the left eye with laceration the laceration is healed poorlyAnd he has decreased vision in the left eye he had a normal brain MRI  Plan to refer to neuro-ophthalmology and plastic surgery

## 2020-11-30 NOTE — Assessment & Plan Note (Signed)
Patient states he is no longer drinking alcohol 

## 2020-11-30 NOTE — Assessment & Plan Note (Signed)
Continue Keppra.
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
## 2020-11-30 NOTE — Progress Notes (Signed)
Subjective:    Patient ID: Michael Hobbs, male    DOB: October 16, 1975, 45 y.o.   MRN: [id number]  44 y.o.[name] to est PCP.  History of alcohol induced seizures severe alcoholism  Last seen by PA Mayers 09/14/20.  I had phone visit on 1/17 pt did not make that appt.   09/21/2020 This patient originally was to see me on January 17 but the office was closed due to the weather I attempted to do a telephone visit but we cannot connect that day.  Subsequent to that the patient has seen my physician assistant Prudence Davidson in recent times January 27 as noted below.  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  He had a severe bruise over the left eye significant periorbital edema and bleeding.  CT scan of the head and subsequent MRIs fails to show any internal bleeding in the brain.  Note this patient's previously had a fall with a subarachnoid hemorrhage related to a seizure related to alcohol use.  He routinely drinks about a pint of hard liquor and a 16 ounce beer daily with his cousin.  When he tries to cut back on his own this is when the seizures attempt to occur.  The patient has seen neurology with Dr. Delice Lesch in 2019 but has not been followed up since.  Patient is on chronic Keppra IV having been started on October 5.  Patient is needing refills of these medications.  He actually went to [place] for detox in March 2020 and then from there [facility] in [place] he was supposed to stay there a month but he signed  himself out AMA  Interestingly on several of the hospitalizations this patient has had he tends to sign himself out [place] early on in the process.  He does not have a mental health provider he is currently attached   Below are copies of documentations from recent provider visits 07/20/20 PA Prudence Davidson visit Michael Hobbs reports that he was seen in the  emergency room on May 23, 2020 after having a fall at work injuring his left eye.  07/17/20 nurse call  patient's mother called in to report increase pain patient's reported since last Tuesday after a fall from a seizure. Patient has been having seizures since Oct. 5 2021. Patient had another seizure within the past few weeks and fell and reinjured left eye. Left eye swollen and forehead swollen. Patient reports pain constant with burning sensation to left eye and forehead area. Patient reports he can not voluntarily open left eye and is required to open left eyelid with his fingers due to swelling. C/o light is painful and wears sunglasses if he has to go outside. Bilateral eyes water, no other drainage reported from eyes. Patient and mother requesting to get MRI. F/u appt scheduled for 08/09/20. No earlier appt noted. Encouraged patient to go to ED due to having another seizure and he fell and hit same eye as he did in October after 1st seizure. Care advise given. Patient verbalized understanding of care advise and to call back or go to [facility] or ED if symptoms worsen. Patient requesting to be notified if earlier OV available this week.   Note from ED visit May 23, 2020  Wound repaired by PA student under supervision of [place], [place]. There is a larger hematoma now post-repair with  some oozing of blood from the wound. I am concerned that there is a venous bleeder underneath that may still be oozing. I offered to take down his stitches, irrigate his wound and explore to identify a bleeding vessel but he declines, states he just wants to go home. Given standard wound care advise, advised that he may continue to ooze from his wound and to return to the ED if it does not stop. He would like resource guide for substance abuse treatment. Advised to stop drinking, no driving until seizure free for 6 months. PCP follow up.   Reports that he continued to have persistent and severe pain above his left eye  and presented to the emergency room again on July 10, 2020.  ED visit 07/10/20   patient with history of seizure disorder, alcohol abuse here for evaluation of progressive left forehead pain andperiocular pain following a seizure with hidden injury over one month ago. While awaiting ED evaluation patient did have a witness seizure. He had recurrent head injury with trauma to the area that was previously hurting. Confirmedwith mother that the hematoma to his left eyebrow is new following this new seizure event. No evidence of acute ocular injury. He does have a large hematoma, given his thrombocytopenia do not feel that drainage at this time would be beneficial to him as it will likely re-accumulate. Discussed with neurologist on-call, given his history of seizures will start on Keppra and give a Keppra load. Case management consulted for assistance with establishing follow-up. Patient does not appear to be in acute alcohol withdrawal at this time. Discussed with patient home care for seizures as well as hematoma. Discussed outpatient follow-up and return precautions.  Reports today that he continues to have disabling pain of his left eye, states that he has been using over-the-counter pain relievers and ice packs without much relief.  Reports that he has been taking the Keppra as directed, has not had any seizures since July 10, 2020.  Saw [facility] 08/10/20: Pain in left side of head above left eye.  Pain 10/10 constant Pain ongoing since October after he had a seizure and sustained a laceration to his left side of his head. He is not sure if the provider hit a nerve but has had pain since then.   Left eye is sensitive to light and wakes up with discharge on the eye in the morning.  October 5th sz at work. He has long hx of seizures. He says he hit head on October 5th, required stitches and now he continues to have pain around that area. He says that area feels  numb and painful a th the same time.   Patient continues to have discomfort.  I think what is happening is that he damaged the nerve serving the left periorbital area.  I think this is caused the equivalent of a neuritis.  I talked to him about this.  I also reviewed all of his recent imaging studies.  There is no concern on CT scanning or MRI findings.  I will treat with gabapentin 300 mg p.o. nightly for 1 week and then 300 mg p.o. twice daily I do not think he will need this for longer than 1 month.  1/27//22  Mayers visit: Michael Hobbs reports that he continues to have exquisite pain above his left eye radiating to the back of his head.  Reports that he may have 2 hours out of the day where his pain has subsided enough for him  to watch videos or TV. Reports that he has been scratching himself on the top of his head and wearing down bald spots from trying to release the pain.  Reports that he did start application for [place] financial assistance, unfortunately he became frustrated with the process and did not finish.  Reports that he did not start the gabapentin  Subarachnoid hematoma with loss of consciousness, sequela ([place]) Encourage patient to begin trial of gabapentin, continue with financial assistance application.  Patient to follow-up with mobile medicine unit in 2 weeks  Today 09/21/2020: Patient is still having pain in the eye it appears to be behind the he states the gabapentin helps a little bit but does not completely relieve it the pain got worse when the gabapentin dose was reduced to 300 mg daily Patient denies any fever Patient denies any recent seizures and he is taking the [place] On arrival blood pressure is 153/102 No prior documentations of hypertension however patient drinks every day a pint of hard liquor and a 16 ounce beer  He was very agitated today and did not want to stay to have his labs drawn he only want to have a quick visit note yesterday he came 30  minutes late we had to reschedule today.  The patient has transportation barriers and also issues with significant mental health is is obvious today  11/30/2020 Patient seen in return follow-up and is still complaining of severe pain over the left eye with a burning sensation as well which feels like a razor blade cut to the forehead.  To go straight back to the ears and of the back of the head.  He is having sleep difficulty as well.  He does report a history of posttraumatic stress and that he saw the cousin being killed when he was younger in [place] and this resulted in a long standing relationship with alcohol developing severe chronic recurrent alcoholism.  Note the injury to the face occurred when he had another spell of seizure falling out and hitting his face forward.  Neurology has seen the patient and documentation of that is as below.  He is to stay on [place] for now.  He states he is no longer drinking alcohol at this time.  Patient states his vision is diminished in the left eye at this time.  Neurology recommended an ophthalmology referral.  The patient states the wound does not heal properly would like a plastic surgical opinion of the left eye.  The patient now has full Medicaid. The patient's not taking valsartan and on arrival blood pressure is excellent.   neuroIMPRESSION: This is a 45 yo RH man with a history of alcohol abuse, traumatic subarachnoid hemorrhage x 2 (alcohol-related), initially seen in 2019 for alcohol-related seizures. EEG in 2019 was normal. He was lost to follow-up and reports 1 seizure in 2020, then 3 in 2021, most recently on 07/10/20 when Levetiracetam 500mg  BID was started. No further seizures since then, he has cut down on alcohol intake. His main concern today is significant pain over the left frontal/eyebrow region after a fall last 05/23/20 s/p laceration repair. He is distressed by how the wound has healed. There is continued swelling over the left  periorbital region, he also reports vision changes. Recommend Ophthalmology evaluation, as well as Plastic Surgery evaluation. We discussed normal brain MRI except for periorbital hematoma, and symptomatic treatment of pain. Increase Gabapentin to 600mg  BID. He does not drive. Follow-up in 6 months, call for any changes.  Past Medical History:  Diagnosis Date  . Alcohol abuse    fell in 2006 and had "brain bleed"  . HTN (hypertension) 09/21/2020  . Nasal fracture 12/01/2012  . PNA (pneumonia) 10/02/2018  . Seizures ([place])   . Subarachnoid hematoma ([place]) 12/01/2012  . Syncope 12/01/2012     Family History  Problem Relation Age of Onset  . Stroke Father      Social History   Socioeconomic History  . Marital status: Single    Spouse name: Not on file  . Number of children: Not on file  . Years of education: Not on file  . Highest education level: Not on file  Occupational History  . Not on file  Tobacco Use  . Smoking status: Current Every Day Smoker    Packs/day: 0.25    Types: Cigars  . Smokeless tobacco: Never Used  . Tobacco comment: unable to afford at this time  Vaping Use  . Vaping Use: Never used  Substance and Sexual Activity  . Alcohol use: Yes    Comment: 1 pint of beer, and a 1/4 pint of liquor   . Drug use: No  . Sexual activity: Not Currently  Other Topics Concern  . 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 gabapentin (NEURONTIN) 600 MG tablet TAKE 1 TABLET (600 MG TOTAL) BY MOUTH 2 (TWO) TIMES DAILY. 60 tablet 11  . levETIRAcetam (KEPPRA) 500 MG tablet TAKE 1 TABLET (500 MG TOTAL) BY MOUTH 2 (TWO) TIMES DAILY. 60 tablet 11  . folic acid (FOLVITE) 1 MG tablet TAKE 1 TABLET (1 MG TOTAL) BY MOUTH DAILY. (Patient not taking: Reported on 11/30/2020) 60 tablet 1  . thiamine 100 MG tablet TAKE 1 TABLET (100 MG TOTAL) BY MOUTH DAILY. (Patient not taking: No sig reported) 60 tablet 1  . acetaminophen-codeine (TYLENOL #3) 300-30 MG tablet TAKE 1 TABLET BY MOUTH EVERY 4 (FOUR) HOURS AS NEEDED FOR UP TO 7 DAYS FOR MODERATE PAIN. (Patient not taking: Reported on 11/30/2020) 30 tablet 0  . valsartan (DIOVAN) 160 MG tablet TAKE 1 TABLET (160 MG TOTAL) BY MOUTH DAILY. (Patient not taking: No sig reported) 90 tablet 1   No facility-administered medications prior to visit.      Review of Systems  Constitutional: Positive for fatigue.  HENT: Negative.   Eyes: Positive for pain and visual disturbance.  Respiratory: Negative.   Cardiovascular: Negative.   Gastrointestinal: Negative.   Genitourinary: Negative.   Musculoskeletal: Negative.   Neurological: Positive for dizziness, tremors, seizures, weakness, light-headedness and headaches. Negative for syncope and speech difficulty.  Hematological: Negative.   Psychiatric/Behavioral: Positive for agitation, behavioral problems, confusion, decreased concentration and hallucinations. Negative for self-injury and suicidal ideas. The patient is nervous/anxious and is hyperactive.        Objective:   Physical Exam Vitals:  11/30/20 0839  BP: 123/86  Pulse: 88  SpO2: 97%  Weight: 164 lb 3.2 oz (74.5 kg)  Height: 5\' 11"  (1.803 m)    Gen: Depressed affect  ENT: Wound has healed however there is swelling over the left upper eyelid he cannot fully open the eye, extraocular movements are intact   Neck: No JVD, no TMG,  no carotid bruits  Lungs: No use of accessory muscles, no dullness to percussion, clear without rales or rhonchi  Cardiovascular: RRR, heart sounds normal, no murmur or gallops, no peripheral edema  Abdomen: soft and NT, no HSM,  BS normal  Musculoskeletal: No deformities, no cyanosis or clubbing  Neuro: alert, non focal  Skin: Warm, no lesions or rashes  All imaging studies including the MRI below are reviewed in  link MRI Brain 07/28/20:  FINDINGS: Brain: Dedicated thin section imaging through the temporal lobes demonstrates normal volume and signal of the hippocampi. There is no evidence of heterotopia or cortical dysplasia.  There is no evidence of an acute infarct, intracranial hemorrhage, mass, midline shift, or extra-axial fluid collection. The ventricles and sulci are normal. The brain is normal in signal. No abnormal enhancement is identified.  Vascular: Major intracranial vascular flow voids are preserved.  Skull and upper cervical spine: Unremarkable bone marrow signal.  Sinuses/Orbits: Decreased size of left periorbital hematoma since the prior CT. Minimal mucosal thickening in the maxillary sinuses. Clear mastoid air cells.  Other: 28 x 6 mm frontal scalp lipoma unchanged from the prior MRI.  IMPRESSION: 1. Unremarkable appearance of the brain. 2. Decreased size of left periorbital hematoma.       Assessment & Plan:  I personally reviewed all images and lab data in the [facility] system as well as any outside material available during this office visit and agree with the  radiology impressions.   HTN (hypertension) Well-controlled off all medications will observe off valsartan for now he has cut back and is stop smoking and no longer drinking alcohol this is contributing to improvement in blood pressure  Periorbital contusion of left eye Periorbital contusion of the left eye with laceration the laceration is healed poorlyAnd he has decreased  vision in the left eye he had a normal brain MRI  Plan to refer to neuro-ophthalmology and plastic surgery  Seizures ([place]) Continue Keppra  Alcoholism ([place]) Patient states he is no longer drinking alcohol  PTSD (post-traumatic stress disorder) Due to adverse childhood experiences will refer to behavioral health for therapy   Michael Hobbs was seen today for headache.  Diagnoses and all orders for this visit:  Blunt trauma of left eye, subsequent encounter -     Ambulatory referral to Ophthalmology -     Ambulatory referral to Plastic Surgery  Periorbital contusion of left eye, sequela -     Ambulatory referral to Ophthalmology -     Ambulatory referral to Plastic Surgery  Subarachnoid hematoma with loss of consciousness, sequela ([place]) -     Ambulatory referral to Ophthalmology  Primary hypertension  Seizures ([place])  Alcoholism ([place])  PTSD (post-traumatic stress disorder)

## 2020-11-30 NOTE — Assessment & Plan Note (Signed)
Well-controlled off all medications will observe off valsartan for now he has cut back and is stop smoking and no longer drinking alcohol this is contributing to improvement in blood pressure

## 2020-11-30 NOTE — Progress Notes (Signed)
Left eye pain and left pain

## 2020-11-30 NOTE — Telephone Encounter (Signed)
Medical Assistant left message on patient's home and cell voicemail. Voicemail states to give a call back to Jazlin Tapscott with [facility] at [phone].  

## 2020-11-30 NOTE — Patient Instructions (Signed)
Stop valsartan Stay off nicotine products Continue to abstain from alcohol Please start the thiamine 103 mg daily and folic acid 2 mg daily Referral to plastic surgery will be made Referral to [facility] ophthalmology will be made Please give our staff your Medicaid number so we can input this into your account Please continue the [place] seizure medications Referral to [facility] for behavioral health therapy and counseling will be made due to your severe posttraumatic stress disorder  Return to Dr. Joya Gaskins 2 months

## 2021-01-12 ENCOUNTER — Other Ambulatory Visit: Payer: Self-pay

## 2021-01-12 ENCOUNTER — Encounter: Payer: Self-pay | Admitting: Plastic Surgery

## 2021-01-12 ENCOUNTER — Ambulatory Visit (INDEPENDENT_AMBULATORY_CARE_PROVIDER_SITE_OTHER): Payer: Self-pay | Admitting: Plastic Surgery

## 2021-01-12 VITALS — BP 147/98 | HR 87 | Ht 72.0 in | Wt 166.0 lb

## 2021-01-12 DIAGNOSIS — D179 Benign lipomatous neoplasm, unspecified: Secondary | ICD-10-CM | POA: Insufficient documentation

## 2021-01-12 DIAGNOSIS — F4310 Post-traumatic stress disorder, unspecified: Secondary | ICD-10-CM

## 2021-01-12 DIAGNOSIS — S066X9S Traumatic subarachnoid hemorrhage with loss of consciousness of unspecified duration, sequela: Secondary | ICD-10-CM

## 2021-01-12 DIAGNOSIS — F1020 Alcohol dependence, uncomplicated: Secondary | ICD-10-CM

## 2021-01-12 DIAGNOSIS — D170 Benign lipomatous neoplasm of skin and subcutaneous tissue of head, face and neck: Secondary | ICD-10-CM

## 2021-01-12 DIAGNOSIS — M795 Residual foreign body in soft tissue: Secondary | ICD-10-CM

## 2021-01-12 DIAGNOSIS — S0512XS Contusion of eyeball and orbital tissues, left eye, sequela: Secondary | ICD-10-CM

## 2021-01-12 DIAGNOSIS — D696 Thrombocytopenia, unspecified: Secondary | ICD-10-CM

## 2021-01-12 DIAGNOSIS — R569 Unspecified convulsions: Secondary | ICD-10-CM

## 2021-01-12 NOTE — Progress Notes (Addendum)
Patient ID: Michael Hobbs, male    DOB: 01-Sep-1975, 45 y.o.   MRN: [id number]   Chief Complaint  Patient presents with  . Advice Only    The patient is a 45 year old male here for evaluation of his head.  The patient has had multiple seizures over the last couple years.  He had a seizure in November and ended up falling and hitting his head.  He has a large mass on the forehead which feels like a lipoma.  It is approximately 4-1/2 x 4-1/2 cm in size it is soft and not mobile.  It is difficult to say whether or not this is creating some of the pressure and pain.  The patient does not think that it is.  Laceration to his left brow.  He has some irregularity and some scaring.  There may be any foreign body as well.  I cannot say with certainty that this has anything to do with his pain.  It certainly does not have anything to do with his seizure disorder.  The pain is located in the left frontal and parietal area.  It is tender to touch.  I do not see any lesion in that area.  The patient states he often gets a comb out and scratches his head because of the pain and discomfort.     Review of Systems  Constitutional: Positive for activity change.  HENT: Positive for facial swelling.   Respiratory: Negative.  Negative for chest tightness.   Gastrointestinal: Negative.   Endocrine: Negative.   Genitourinary: Negative.   Musculoskeletal: Negative.   Skin: Negative.   Neurological: Negative.   Hematological: Negative.   Psychiatric/Behavioral: Negative.     Past Medical History:  Diagnosis Date  . Alcohol abuse    fell in 2006 and had "brain bleed"  . HTN (hypertension) 09/21/2020  . Nasal fracture 12/01/2012  . PNA (pneumonia) 10/02/2018  . Seizures ([place])   . Subarachnoid hematoma ([place]) 12/01/2012  . Syncope 12/01/2012    Past Surgical History:  Procedure Laterality Date  . HERNIA REPAIR    . right shoulder surgery.        Current Outpatient Medications:  .  folic acid (FOLVITE) 1  MG tablet, TAKE 1 TABLET (1 MG TOTAL) BY MOUTH DAILY., Disp: 60 tablet, Rfl: 1 .  gabapentin (NEURONTIN) 600 MG tablet, TAKE 1 TABLET (600 MG TOTAL) BY MOUTH 2 (TWO) TIMES DAILY., Disp: 60 tablet, Rfl: 11 .  levETIRAcetam (KEPPRA) 500 MG tablet, TAKE 1 TABLET (500 MG TOTAL) BY MOUTH 2 (TWO) TIMES DAILY., Disp: 60 tablet, Rfl: 11 .  thiamine 100 MG tablet, TAKE 1 TABLET (100 MG TOTAL) BY MOUTH DAILY., Disp: 60 tablet, Rfl: 1   Objective:   Vitals:   01/12/21 1403  BP: (!) 147/98  Pulse: 87  SpO2: 97%    Physical Exam Vitals and nursing note reviewed.  Constitutional:      Appearance: Normal appearance.  HENT:     Head: Normocephalic.   Cardiovascular:     Rate and Rhythm: Normal rate.     Pulses: Normal pulses.  Pulmonary:     Effort: Pulmonary effort is normal. No respiratory distress.  Abdominal:     General: Abdomen is flat. There is no distension.  Musculoskeletal:        General: Swelling, tenderness and signs of injury present. No deformity. Normal range of motion.  Skin:    General: Skin is warm.     Capillary Refill:  Capillary refill takes less than 2 seconds.     Coloration: Skin is not jaundiced.     Findings: No bruising.  Neurological:     General: No focal deficit present.     Mental Status: He is alert and oriented to person, place, and time.  Psychiatric:        Mood and Affect: Mood normal.        Behavior: Behavior normal.     Assessment & Plan:  Seizures (HCC)  PTSD (post-traumatic stress disorder)  Periorbital contusion of left eye, sequela  Alcoholism ([place])  Subarachnoid hematoma with loss of consciousness, sequela (HCC)  Thrombocytopenia (HCC)  Lipoma of face  Foreign body (FB) in soft tissue  I am willing to do the following: Vision of forehead lipoma and excision of possible foreign body left upper brow.  I have stressed to the patient that I do not know if this has anything to do with his pain and cannot guarantee in any way shape  or form that this will change the pain he is having in his head.  This will not have any effect on his seizure disorder.  Pictures were obtained of the patient and placed in the chart with the patient's or guardian's permission.  [place], DO

## 2021-01-30 ENCOUNTER — Other Ambulatory Visit: Payer: Self-pay

## 2021-01-30 ENCOUNTER — Other Ambulatory Visit: Payer: Self-pay | Admitting: Critical Care Medicine

## 2021-01-30 ENCOUNTER — Telehealth: Payer: Self-pay

## 2021-01-30 DIAGNOSIS — D696 Thrombocytopenia, unspecified: Secondary | ICD-10-CM

## 2021-01-30 DIAGNOSIS — F10939 Alcohol use, unspecified with withdrawal, unspecified: Secondary | ICD-10-CM

## 2021-01-30 MED ORDER — LEVETIRACETAM 500 MG PO TABS
ORAL_TABLET | Freq: Two times a day (BID) | ORAL | 11 refills | Status: DC
  Filled 2021-01-30 – 2021-02-06 (×2): qty 60, 30d supply, fill #0
  Filled 2021-04-09: qty 60, 30d supply, fill #1

## 2021-01-30 NOTE — Telephone Encounter (Signed)
Requested medication (s) are due for refill today: no  Requested medication (s) are on the active medication list: yes  Last refill:  11/01/20 # 60 11 RF  Future visit scheduled: yes  Notes to clinic:  Prescription written by neurologist PEC NT not delegated to refill or REFUSE this med.   Requested Prescriptions  Pending Prescriptions Disp Refills   levETIRAcetam (KEPPRA) 500 MG tablet 60 tablet 11    Sig: TAKE 1 TABLET (500 MG TOTAL) BY MOUTH 2 (TWO) TIMES DAILY.      Not Delegated - Neurology:  Anticonvulsants Failed - 01/30/2021  2:46 PM      Failed - This refill cannot be delegated      Failed - PLT in normal range and within 360 days    Platelets  Date Value Ref Range Status  07/10/2020 71 (L) 150 - 400 K/uL Final    Comment:    REPEATED TO VERIFY PLATELET COUNT CONFIRMED BY SMEAR Immature Platelet Fraction may be clinically indicated, consider ordering this additional test [id number]           Passed - HCT in normal range and within 360 days    HCT  Date Value Ref Range Status  07/10/2020 40.6 39.0 - 52.0 % Final          Passed - HGB in normal range and within 360 days    Hemoglobin  Date Value Ref Range Status  07/10/2020 13.5 13.0 - 17.0 g/dL Final          Passed - WBC in normal range and within 360 days    WBC  Date Value Ref Range Status  07/10/2020 4.5 4.0 - 10.5 K/uL Final          Passed - Valid encounter within last 12 months    Recent Outpatient Visits           2 months ago Blunt trauma of left eye, subsequent encounter   [place] Elsie Stain, MD   4 months ago Alcohol withdrawal seizure with complication [facility])   [place] Elsie Stain, MD   4 months ago Periorbital contusion of left eye, subsequent encounter   [place] Elsie Stain, MD   5 months ago Periorbital contusion of left eye, subsequent encounter   [place], Darrick Penna, MD   6 months ago Subarachnoid hematoma, with loss of consciousness of 30 minutes or less, sequela [facility])   [place], [place]       Future Appointments             In 1 month Joya Gaskins, Burnett Harry, MD [place]

## 2021-01-30 NOTE — Telephone Encounter (Signed)
Copied from [place] [id number]. Topic: Quick Communication - Rx Refill/Question >> Jan 30, 2021  2:42 PM Tessa Lerner A wrote: Medication: Rx #: [id number]  levETIRAcetam (KEPPRA) 500 MG tablet    Has the patient contacted their pharmacy? No. (Agent: If no, request that the patient contact the pharmacy for the refill.) (Agent: If yes, when and what did the pharmacy advise?)  Preferred Pharmacy (with phone number or street name): [facility] [place]  Phone:  [phone] Fax:  [phone]   Agent: Please be advised that RX refills may take up to 3 business days. We ask that you follow-up with your pharmacy.

## 2021-01-30 NOTE — Telephone Encounter (Signed)
Refills for Levetiracetam will be sent. Pls have patient f/u with Dr. Joya Gaskins for continued monitoring of low platelet count. Thanks

## 2021-01-30 NOTE — Telephone Encounter (Signed)
Michael Sprang, MD  You 25 minutes ago (3:05 PM)     Refills for Levetiracetam will be sent. Pls have patient f/u with Dr. Joya Gaskins for continued monitoring of low platelet count. Thanks    Called pt and informed him that he needs to call [facility] to address this issue. Pt has a h/o low platlets and the last level was done 07/10/20 and it was 71.  Pt stated he will call [facility] today.

## 2021-01-31 NOTE — Telephone Encounter (Signed)
Keep July appt.  Have pt come in for lab draw any time  I put cbc order in for future

## 2021-01-31 NOTE — Telephone Encounter (Signed)
Patient is aware labs are in epic and will come by tomorrow sometime

## 2021-01-31 NOTE — Addendum Note (Signed)
Addended by: Elsie Stain on: 01/31/2021 09:33 AM   Modules accepted: Orders

## 2021-01-31 NOTE — Telephone Encounter (Signed)
Call patient and place on lab schedule lab are in epic.

## 2021-01-31 NOTE — Telephone Encounter (Signed)
Does patient need to be seen sooner that July.

## 2021-02-06 ENCOUNTER — Other Ambulatory Visit: Payer: Self-pay

## 2021-02-06 ENCOUNTER — Ambulatory Visit: Payer: Self-pay | Attending: Critical Care Medicine

## 2021-02-06 DIAGNOSIS — D696 Thrombocytopenia, unspecified: Secondary | ICD-10-CM

## 2021-02-07 LAB — CBC WITH DIFFERENTIAL/PLATELET
Basophils Absolute: 0 10*3/uL (ref 0.0–0.2)
Basos: 1 %
EOS (ABSOLUTE): 0.1 10*3/uL (ref 0.0–0.4)
Eos: 2 %
Hematocrit: 45.5 % (ref 37.5–51.0)
Hemoglobin: 16.4 g/dL (ref 13.0–17.7)
Immature Grans (Abs): 0 10*3/uL (ref 0.0–0.1)
Immature Granulocytes: 0 %
Lymphocytes Absolute: 2 10*3/uL (ref 0.7–3.1)
Lymphs: 33 %
MCH: 37.4 pg — ABNORMAL HIGH (ref 26.6–33.0)
MCHC: 36 g/dL — ABNORMAL HIGH (ref 31.5–35.7)
MCV: 104 fL — ABNORMAL HIGH (ref 79–97)
Monocytes Absolute: 0.5 10*3/uL (ref 0.1–0.9)
Monocytes: 9 %
Neutrophils Absolute: 3.4 10*3/uL (ref 1.4–7.0)
Neutrophils: 55 %
Platelets: 184 10*3/uL (ref 150–450)
RBC: 4.38 x10E6/uL (ref 4.14–5.80)
RDW: 12.2 % (ref 11.6–15.4)
WBC: 6.1 10*3/uL (ref 3.4–10.8)

## 2021-02-08 ENCOUNTER — Other Ambulatory Visit: Payer: Self-pay

## 2021-03-06 ENCOUNTER — Ambulatory Visit: Payer: Self-pay | Attending: Critical Care Medicine | Admitting: Critical Care Medicine

## 2021-03-06 ENCOUNTER — Encounter: Payer: Self-pay | Admitting: Critical Care Medicine

## 2021-03-06 ENCOUNTER — Other Ambulatory Visit: Payer: Self-pay

## 2021-03-06 VITALS — BP 154/91 | HR 77 | Ht 72.0 in | Wt 171.0 lb

## 2021-03-06 DIAGNOSIS — D696 Thrombocytopenia, unspecified: Secondary | ICD-10-CM

## 2021-03-06 DIAGNOSIS — F1011 Alcohol abuse, in remission: Secondary | ICD-10-CM

## 2021-03-06 DIAGNOSIS — M795 Residual foreign body in soft tissue: Secondary | ICD-10-CM

## 2021-03-06 DIAGNOSIS — Z720 Tobacco use: Secondary | ICD-10-CM

## 2021-03-06 DIAGNOSIS — R569 Unspecified convulsions: Secondary | ICD-10-CM

## 2021-03-06 DIAGNOSIS — I10 Essential (primary) hypertension: Secondary | ICD-10-CM

## 2021-03-06 DIAGNOSIS — F17210 Nicotine dependence, cigarettes, uncomplicated: Secondary | ICD-10-CM

## 2021-03-06 DIAGNOSIS — S066X9S Traumatic subarachnoid hemorrhage with loss of consciousness of unspecified duration, sequela: Secondary | ICD-10-CM

## 2021-03-06 MED ORDER — AMLODIPINE BESYLATE 5 MG PO TABS
5.0000 mg | ORAL_TABLET | Freq: Every day | ORAL | 3 refills | Status: DC
  Filled 2021-03-06: qty 30, 30d supply, fill #0

## 2021-03-06 MED ORDER — FOLIC ACID 1 MG PO TABS
ORAL_TABLET | Freq: Every day | ORAL | 1 refills | Status: AC
  Filled 2021-03-06: qty 60, 60d supply, fill #0

## 2021-03-06 MED ORDER — THIAMINE HCL 100 MG PO TABS
ORAL_TABLET | Freq: Every day | ORAL | 1 refills | Status: AC
  Filled 2021-03-06: qty 60, fill #0

## 2021-03-06 NOTE — Progress Notes (Signed)
States that he is still having pain in his eye and back of head,

## 2021-03-06 NOTE — Assessment & Plan Note (Addendum)
  .   Current smoking consumption amount: 1/2 pack a day  . Dicsussion on advise to quit smoking and smoking impacts: Cardiovascular health impacts  . Patient's willingness to quit: Not yet ready to fully quit  . Methods to quit smoking discussed: Behavioral modification  . Medication management of smoking session drugs discussed: Nicotine replacement  . Resources provided:  AVS    . Setting quit date not established  . Follow-up arranged 3 months   Time spent counseling the patient: 5 minutes

## 2021-03-06 NOTE — Assessment & Plan Note (Signed)
At the last visit blood counts were normal thrombocytopenia resolved off alcohol use

## 2021-03-06 NOTE — Assessment & Plan Note (Signed)
History of seizures none since being on Keppra continue same follow-up with neurology

## 2021-03-06 NOTE — Assessment & Plan Note (Signed)
Patient states he is no longer drinking alcohol will observe

## 2021-03-06 NOTE — Assessment & Plan Note (Signed)
Blood pressure remains elevated  Discussed this with the patient he was reluctant to start medications I indicated he needs to quit smoking and begin the amlodipine at 5 mg daily

## 2021-03-06 NOTE — Assessment & Plan Note (Signed)
This injury has resolved however it is left him with some chronic postconcussive recurrent headaches

## 2021-03-06 NOTE — Assessment & Plan Note (Signed)
Per plastic surgery

## 2021-03-06 NOTE — Progress Notes (Signed)
Subjective:    Patient ID: Michael Hobbs, male    DOB: 07-Sep-1975, 45 y.o.   MRN: [id number]  44 y.o.[name] to est PCP.  History of alcohol induced seizures severe alcoholism  Last seen by PA Mayers 09/14/20.  I had phone visit on 1/17 pt did not make that appt.   09/21/2020 This patient originally was to see me on January 17 but the office was closed due to the weather I attempted to do a telephone visit but we cannot connect that day.  Subsequent to that the patient has seen my physician assistant Prudence Davidson in recent times January 27 as noted below.  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  He had a severe bruise over the left eye significant periorbital edema and bleeding.  CT scan of the head and subsequent MRIs fails to show any internal bleeding in the brain.  Note this patient's previously had a fall with a subarachnoid hemorrhage related to a seizure related to alcohol use.  He routinely drinks about a pint of hard liquor and a 16 ounce beer daily with his cousin.  When he tries to cut back on his own this is when the seizures attempt to occur.  The patient has seen neurology with Dr. Delice Lesch in 2019 but has not been followed up since.  Patient is on chronic Keppra IV having been started on October 5.  Patient is needing refills of these medications.  He actually went to [place] for detox in March 2020 and then from there [facility] in [place] he was supposed to stay there a month but he signed  himself out AMA  Interestingly on several of the hospitalizations this patient has had he tends to sign himself out [place] early on in the process.  He does not have a mental health provider he is currently attached   Below are copies of documentations from recent provider visits 07/20/20 PA Prudence Davidson visit Michael Hobbs reports that he was seen in the  emergency room on May 23, 2020 after having a fall at work injuring his left eye.   07/17/20 nurse call  patient's mother called in to report increase pain patient's reported since last Tuesday after a fall from a seizure. Patient has been having seizures since Oct. 5 2021. Patient had another seizure within the past few weeks and fell and reinjured left eye. Left eye swollen and forehead swollen. Patient reports pain constant with burning sensation to left eye and forehead area. Patient reports he can not voluntarily open left eye and is required to open left eyelid with his fingers due to swelling. C/o light is painful and wears sunglasses if he has to go outside.  Bilateral eyes water, no other drainage reported from eyes. Patient and mother requesting to get MRI. F/u appt scheduled for 08/09/20. No earlier appt noted. Encouraged patient to go to ED due to having another seizure and he fell and hit same eye as he did in October after 1st seizure. Care advise given. Patient verbalized understanding of care advise and to call back or go to [facility] or ED if symptoms worsen. Patient requesting to be notified if earlier OV available this week.    Note from ED visit May 23, 2020   Wound repaired by PA student under supervision of [place], [place]. There is a larger  hematoma now post-repair with some oozing of blood from the wound. I am concerned that there is a venous bleeder underneath that may still be oozing. I offered to take down his stitches, irrigate his wound and explore to identify a bleeding vessel but he declines, states he just wants to go home. Given standard wound care advise, advised that he may continue to ooze from his wound and to return to the ED if it does not stop. He would like resource guide for substance abuse treatment. Advised to stop drinking, no driving until seizure free for 6 months. PCP follow up.     Reports that he continued to have persistent and severe pain above his left eye  and presented to the emergency room again on July 10, 2020.   ED visit 07/10/20                          patient with history of seizure disorder, alcohol abuse here for evaluation of progressive left forehead pain and periocular pain following a seizure with hidden injury over one month ago. While awaiting ED evaluation patient did have a witness seizure. He had recurrent head injury with trauma to the area that was previously hurting. Confirmed with mother that the hematoma to his left eyebrow is new following this new seizure event. No evidence of acute ocular injury. He does have a large hematoma, given his thrombocytopenia do not feel that drainage at this time would be beneficial to him as it will likely re-accumulate. Discussed with neurologist on-call, given his history of seizures will start on Keppra and give a Keppra load. Case management consulted for assistance with establishing follow-up. Patient does not appear to be in acute alcohol withdrawal at this time. Discussed with patient home care for seizures as well as hematoma. Discussed outpatient follow-up and return precautions.   Reports today that he continues to have disabling pain of his left eye, states that he has been using over-the-counter pain relievers and ice packs without much relief.   Reports that he has been taking the Keppra as directed, has not had any seizures since July 10, 2020.  Saw [facility] 08/10/20: Pain in left side of head above left eye.  Pain 10/10 constant Pain ongoing since October after he had a seizure and sustained a laceration to his left side of his head. He is not sure if the provider hit a nerve but has had pain since then.    Left eye is sensitive to light and wakes up with discharge on the eye in the morning.   October 5th sz at work. He has long hx of seizures. He says he hit head on October 5th, required stitches and now he continues to have pain around that area. He says that area feels  numb and painful a th the same time.    Patient continues to have discomfort.  I think what is happening is that he damaged the nerve serving the left periorbital area.  I think this is caused the equivalent of a neuritis.  I talked to him about this.  I also reviewed all of his recent imaging studies.  There is no concern on CT scanning or MRI findings.  I will treat with gabapentin 300 mg p.o. nightly for 1 week and then 300 mg p.o. twice daily I do not think he will need this for longer than 1 month.  1/27//22  [facility] visit: Michael Hobbs reports that  he continues to have exquisite pain above his left eye radiating to the back of his head.   Reports that he may have 2 hours out of the day where his pain has subsided enough for him to watch videos or TV. Reports that he has been scratching himself on the top of his head and wearing down bald spots from trying to release the pain.   Reports that he did start application for [facility] financial assistance, unfortunately he became frustrated with the process and did not finish.   Reports that he did not start the gabapentin  Subarachnoid hematoma with loss of consciousness, sequela ([place]) Encourage patient to begin trial of gabapentin, continue with financial assistance application.  Patient to follow-up with mobile medicine unit in 2 weeks  Today 09/21/2020: Patient is still having pain in the eye it appears to be behind the he states the gabapentin helps a little bit but does not completely relieve it the pain got worse when the gabapentin dose was reduced to 300 mg daily Patient denies any fever Patient denies any recent seizures and he is taking the [place] On arrival blood pressure is 153/102 No prior documentations of hypertension however patient drinks every day a pint of hard liquor and a 16 ounce beer  He was very agitated today and did not want to stay to have his labs drawn he only want to have a quick visit note yesterday he came 30  minutes late we had to reschedule today.  The patient has transportation barriers and also issues with significant mental health is is obvious today  11/30/2020 Patient seen in return follow-up and is still complaining of severe pain over the left eye with a burning sensation as well which feels like a razor blade cut to the forehead.  To go straight back to the ears and of the back of the head.  He is having sleep difficulty as well.  He does report a history of posttraumatic stress and that he saw the cousin being killed when he was younger in [place] and this resulted in a long standing relationship with alcohol developing severe chronic recurrent alcoholism.  Note the injury to the face occurred when he had another spell of seizure falling out and hitting his face forward.  Neurology has seen the patient and documentation of that is as below.  He is to stay on [place] for now.  He states he is no longer drinking alcohol at this time.  Patient states his vision is diminished in the left eye at this time.  Neurology recommended an ophthalmology referral.  The patient states the wound does not heal properly would like a plastic surgical opinion of the left eye.  The patient now has full Medicaid. The patient's not taking valsartan and on arrival blood pressure is excellent.   neuroIMPRESSION: This is a 45 yo RH man with a history of alcohol abuse, traumatic subarachnoid hemorrhage x 2 (alcohol-related), initially seen in 2019 for alcohol-related seizures. EEG in 2019 was normal. He was lost to follow-up and reports 1 seizure in 2020, then 3 in 2021, most recently on 07/10/20 when Levetiracetam 500mg  BID was started. No further seizures since then, he has cut down on alcohol intake. His main concern today is significant pain over the left frontal/eyebrow region after a fall last 05/23/20 s/p laceration repair. He is distressed by how the wound has healed. There is continued swelling over the left  periorbital region, he also reports vision changes.  Recommend Ophthalmology evaluation, as well as Plastic Surgery evaluation. We discussed normal brain MRI except for periorbital hematoma, and symptomatic treatment of pain. Increase Gabapentin to 600mg  BID. He does not drive. Follow-up in 6 months, call for any changes.   03/06/2021 Patient seen in return follow-up he did go to neuro-ophthalmology in [facility] exam there was unremarkable except for mild presbyopia.  Patient also saw plastic surgery and he does have a lipoma over his left eyelid and possibly foreign body material in the wound.  This is why the eyelid is drooping.  He note his MRI was normal.  He continues to have headaches.  However he has had no further seizures since being on Keppra 500 mg twice daily.  The patient continues to be frustrated about the chronic pain he is experiencing and affected has on his vision.  He is easily triggered with anger at this time.  He is not on opiates and does wish to avoid them.  He states gabapentin is of no benefit and he discontinued it.  He is still smoking about a half a pack a day of cigarettes.  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  On arrival blood pressure remains elevated 154/91 is a not on blood pressure medications.  Past Medical History:  Diagnosis Date   Alcohol abuse    fell in 2006 and had "brain bleed"   HTN (hypertension) 09/21/2020   Nasal fracture 12/01/2012   Periorbital contusion of left eye 12/01/2012   PNA (pneumonia) 10/02/2018   Seizures (HCC)    Subarachnoid hematoma ([place]) 12/01/2012   Syncope 12/01/2012   Thrombocytopenia ([place]) 09/21/2020     Family History  Problem Relation Age of Onset   Stroke Father      Social History   Socioeconomic History   Marital status: Single    Spouse name: Not on file   Number of children: Not on file   Years of education: Not on file   Highest education level: Not on file   Occupational History   Not on file  Tobacco Use   Smoking status: Every Day    Packs/day: 0.25    Types: Cigars, Cigarettes   Smokeless tobacco: Never   Tobacco comments:    unable to afford at this time  Vaping Use   Vaping Use: Never used  Substance and Sexual Activity   Alcohol use: Yes    Comment: 1 pint of beer, and a 1/4 pint of liquor    Drug use: No   Sexual activity: Not Currently  Other Topics Concern   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 60 tablet 11  folic acid (FOLVITE) 1 MG tablet TAKE 1 TABLET (1 MG TOTAL) BY MOUTH DAILY. 60 tablet 1   thiamine 100 MG tablet TAKE 1 TABLET (100 MG TOTAL) BY MOUTH DAILY. 60 tablet 1   gabapentin (NEURONTIN) 600 MG tablet TAKE 1 TABLET (600 MG TOTAL) BY MOUTH 2 (TWO) TIMES DAILY. (Patient not taking: Reported on 03/06/2021) 60 tablet 11   No facility-administered medications prior to visit.      Review of Systems  Constitutional: Negative.   HENT: Negative.    Eyes:  Positive for photophobia, pain and visual disturbance.  Respiratory: Negative.    Cardiovascular: Negative.   Gastrointestinal: Negative.   Genitourinary: Negative.   Musculoskeletal: Negative.   Neurological:  Positive for seizures and headaches.  Hematological: Negative.    Psychiatric/Behavioral:  Positive for behavioral problems, decreased concentration and dysphoric mood.       Objective:   Physical Exam Vitals:   03/06/21 0950  BP: (!) 154/91  Pulse: 77  SpO2: 96%  Weight: 171 lb (77.6 kg)  Height: 6' (1.829 m)    Gen: Depressed affect  ENT: Wound has healed however there is swelling over the left upper eyelid he cannot fully open the eye, extraocular movements are intact   Neck: No JVD, no TMG, no carotid bruits  Lungs: No use of accessory muscles, no dullness to percussion, clear without rales or rhonchi  Cardiovascular: RRR, heart sounds normal, no murmur or gallops, no peripheral edema  Abdomen: soft and NT, no HSM,  BS normal  Musculoskeletal: No deformities, no cyanosis or clubbing  Neuro: alert, non focal  Skin: Warm, no lesions or rashes  All imaging studies including the MRI below are reviewed in [place] link MRI Brain 07/28/20:  FINDINGS: Brain: Dedicated thin section imaging through the temporal lobes demonstrates normal volume and signal of the hippocampi. There is no evidence of heterotopia or cortical dysplasia.   There is no evidence of an acute infarct, intracranial hemorrhage, mass, midline shift, or extra-axial fluid collection. The ventricles and sulci are normal. The brain is normal in signal. No abnormal enhancement is identified.   Vascular: Major intracranial vascular flow voids are preserved.   Skull and upper cervical spine: Unremarkable bone marrow signal.   Sinuses/Orbits: Decreased size of left periorbital hematoma since the prior CT. Minimal mucosal thickening in the maxillary sinuses. Clear mastoid air cells.   Other: 28 x 6 mm frontal scalp lipoma unchanged from the prior MRI.   IMPRESSION: 1. Unremarkable appearance of the brain. 2. Decreased size of left periorbital hematoma.        Assessment & Plan:  I personally reviewed all images and lab data in the [facility] system as well as any  outside material available during this office visit and agree with the  radiology impressions.   HTN (hypertension) Blood pressure remains elevated  Discussed this with the patient he was reluctant to start medications I indicated he needs to quit smoking and begin the amlodipine at 5 mg daily  Subarachnoid hematoma ([place]) This injury has resolved however it is left him with some chronic postconcussive recurrent headaches  History of alcohol abuse Patient states he is no longer drinking alcohol will observe  Seizures ([place]) History of seizures none since being on Keppra continue same follow-up with neurology  Foreign body (FB) in soft tissue Per plastic surgery  Thrombocytopenia ([place]) At the last visit blood counts were normal thrombocytopenia resolved off alcohol use  Tobacco use    Current smoking consumption  amount: 1/2 pack a day  Dicsussion on advise to quit smoking and smoking impacts: Cardiovascular health impacts  Patient's willingness to quit: Not yet ready to fully quit  Methods to quit smoking discussed: Behavioral modification  Medication management of smoking session drugs discussed: Nicotine replacement  Resources provided:  AVS    Setting quit date not established  Follow-up arranged 3 months   Time spent counseling the patient: 5 minutes    [place] was seen today for eye pain.  Diagnoses and all orders for this visit:  Primary hypertension  Subarachnoid hematoma with loss of consciousness, sequela (HCC)  History of alcohol abuse  Seizures ([place])  Foreign body (FB) in soft tissue  Thrombocytopenia ([place])  Tobacco use  Other orders -     amLODipine (NORVASC) 5 MG tablet; Take 1 tablet (5 mg total) by mouth daily. -     thiamine 100 MG tablet; TAKE 1 TABLET (100 MG TOTAL) BY MOUTH DAILY. -     folic acid (FOLVITE) 1 MG tablet; TAKE 1 TABLET (1 MG TOTAL) BY MOUTH DAILY.

## 2021-03-06 NOTE — Patient Instructions (Signed)
Obtain a 1.75 reading glasses as recommended by ophthalmology  Begin amlodipine 5 mg daily for blood pressure  Focus on smoking cessation as we discussed  Discontinue gabapentin  Continue thiamine folic acid and Keppra  Pursue Medicaid disability with your attorney we can give you access to our records  Follow-up with Dr. Tedra Coupe him to make a decision to have the place on your upper removed it is elective and does not need to be done on an urgent basis  Return to see Dr. Joya Gaskins 3 months

## 2021-03-13 ENCOUNTER — Other Ambulatory Visit: Payer: Self-pay

## 2021-04-09 ENCOUNTER — Other Ambulatory Visit: Payer: Self-pay

## 2021-04-11 ENCOUNTER — Other Ambulatory Visit: Payer: Self-pay

## 2021-04-19 ENCOUNTER — Other Ambulatory Visit: Payer: Self-pay

## 2021-04-19 ENCOUNTER — Encounter: Payer: Self-pay | Admitting: Nurse Practitioner

## 2021-04-19 ENCOUNTER — Telehealth (INDEPENDENT_AMBULATORY_CARE_PROVIDER_SITE_OTHER): Payer: Self-pay | Admitting: Nurse Practitioner

## 2021-04-19 DIAGNOSIS — S066X9S Traumatic subarachnoid hemorrhage with loss of consciousness of unspecified duration, sequela: Secondary | ICD-10-CM

## 2021-04-19 DIAGNOSIS — S0512XS Contusion of eyeball and orbital tissues, left eye, sequela: Secondary | ICD-10-CM

## 2021-04-19 NOTE — Patient Instructions (Signed)
Periorbital contusion left eye History of eye trauma:  Continue current medications  Please keep upcoming referral to neurology  Will place referral to general surgery for further evaluation  Follow up:  Follow up with PCP as scheduled

## 2021-04-19 NOTE — Progress Notes (Signed)
Virtual Visit via Video Note  I connected with Michael Hobbs on 04/19/21 at  9:50 AM EDT by a video enabled telemedicine application and verified that I am speaking with the correct person using two identifiers.  Location: Patient: home Provider: office   I discussed the limitations of evaluation and management by telemedicine and the availability of in person appointments. The patient expressed understanding and agreed to proceed.  History of Present Illness:  Patient presents today for acute visit through video visit.  This is a patient of Dr. Joya Gaskins.  Patient has been seen multiple times over the past year for subarachnoid hematoma and periorbital contusion above left eye.  Patient states that he has had severe pain to his left eyebrow region and left side of his head for the past year.  He does have an upcoming appointment scheduled with neurology this month.  Patient was referred to plastic surgery but had issues with finances/insurance.  He is requesting a new referral be placed to general surgery.  We will place referral per patient request. Denies f/c/s, n/v/d, hemoptysis, PND, chest pain or edema.     Observations/Objective:  Vitals with BMI 03/06/2021 01/12/2021 11/30/2020  Height '6\' 0"'$  '6\' 0"'$  '5\' 11"'$   Weight 171 lbs 166 lbs 164 lbs 3 oz  BMI 23.19 [id number]  Systolic 123456 [id number]  Diastolic 91 98 86  Pulse 77 87 88   Edema noted to left brow line and left upper eye lid.    Assessment and Plan:  Periorbital contusion left eye History of eye trauma:  Continue current medications  Please keep upcoming referral to neurology  Will place referral to general surgery for further evaluation  Follow up:  Follow up with PCP as scheduled   Follow Up Instructions:    I discussed the assessment and treatment plan with the patient. The patient was provided an opportunity to ask questions and all were answered. The patient agreed with the plan and demonstrated an understanding  of the instructions.   The patient was advised to call back or seek an in-person evaluation if the symptoms worsen or if the condition fails to improve as anticipated.  I provided 23 minutes of non-face-to-face time during this encounter.   Fenton Foy, NP

## 2021-04-24 ENCOUNTER — Ambulatory Visit: Payer: Self-pay | Admitting: *Deleted

## 2021-04-24 NOTE — Telephone Encounter (Signed)
Noted  

## 2021-04-24 NOTE — Telephone Encounter (Signed)
Pt calling to request referral for eye pain "To someone other than neurologist."  Advised he is scheduled for appt with General Surgery 05/02/21. Appt notes with Eduardo Osier reviewed, seen 04/20/21. Information given as to appt date time and place. Advised to keep appts with General Surgeon and neurologist as scheduled. Pt verbalizes understanding.

## 2021-04-24 NOTE — Telephone Encounter (Signed)
Reason for Disposition  General information question, no triage required and triager able to answer question  Answer Assessment - Initial Assessment Questions 1. REASON FOR CALL or QUESTION: "What is your reason for calling today?" or "How can I best help you?" or "What question do you have that I can help answer?"     Referrals  Protocols used: Information Only Call - No Triage-A-AH

## 2021-05-02 ENCOUNTER — Encounter: Payer: Self-pay | Admitting: Surgery

## 2021-05-02 ENCOUNTER — Other Ambulatory Visit: Payer: Self-pay

## 2021-05-02 ENCOUNTER — Ambulatory Visit (INDEPENDENT_AMBULATORY_CARE_PROVIDER_SITE_OTHER): Payer: Self-pay | Admitting: Surgery

## 2021-05-02 VITALS — BP 130/85 | HR 95 | Temp 98.3°F | Ht 72.0 in | Wt 169.0 lb

## 2021-05-02 DIAGNOSIS — R569 Unspecified convulsions: Secondary | ICD-10-CM

## 2021-05-02 DIAGNOSIS — H5712 Ocular pain, left eye: Secondary | ICD-10-CM

## 2021-05-02 NOTE — Patient Instructions (Addendum)
Please call if you have any questions or concerns. Be sure to follow up with your Neurologist next week.

## 2021-05-04 ENCOUNTER — Encounter: Payer: Self-pay | Admitting: Surgery

## 2021-05-04 NOTE — Progress Notes (Signed)
Patient ID: Michael Hobbs, male   DOB: Jul 10, 1976, 45 y.o.   MRN: [id number]  HPI Michael Hobbs is a 45 y.o. male seen in consultation at the request of Dr. Joya Hobbs.  He comes in for chronic left orbital pain.  He reports that he had head trauma after a seizure disorder  causing contusion and laceration and soft tissue trauma to the left orbit approximately 10 months ago.  He did have a laceration repair in the emergency room.  He did have a significant hematoma and some soft tissue deformity.  He reported that since then he has pain that is intermittent daily moderate intensity and sharp.  No specific alleviating or aggravating factors.  He is very frustrated because of the chronicity of the pain.  He has already been seen by Dr. Tedra Hobbs him for plastic surgery.  Did have a CT scan from last year that have personally reviewed showing evidence of soft tissue hematoma on the left orbit.  This was when he was a an acute illness.  He also had an MRI that have personally reviewed showing evidence of improvement of the hematoma on the left side of the orbit.  Recent CBC showed a white count of 6 hemoglobin of 16 and platelets of 184.  He did have history of chronic thrombocytopenia.  He actively smokes and apparently he does have continued EtOH abuse. He also had a prior history of subarachnoid hemorrhage.  HPI  Past Medical History:  Diagnosis Date   Alcohol abuse    fell in 2006 and had "brain bleed"   HTN (hypertension) 09/21/2020   Nasal fracture 12/01/2012   Periorbital contusion of left eye 12/01/2012   PNA (pneumonia) 10/02/2018   Seizures ([place])    Subarachnoid hematoma ([place]) 12/01/2012   Syncope 12/01/2012   Thrombocytopenia ([place]) 09/21/2020    Past Surgical History:  Procedure Laterality Date   HERNIA REPAIR     right shoulder surgery.      Family History  Problem Relation Age of Onset   Stroke Father     Social History Social History   Tobacco Use   Smoking status: Every Day     Packs/day: 0.25    Types: Cigars, Cigarettes   Smokeless tobacco: Never   Tobacco comments:    unable to afford at this time  Vaping Use   Vaping Use: Never used  Substance Use Topics   Alcohol use: Yes    Comment: 1 pint of beer, and a 1/4 pint of liquor    Drug use: No    No Known Allergies  Current Outpatient Medications  Medication Sig Dispense Refill   amLODipine (NORVASC) 5 MG tablet Take 1 tablet (5 mg total) by mouth daily. 60 tablet 3   folic acid (FOLVITE) 1 MG tablet TAKE 1 TABLET (1 MG TOTAL) BY MOUTH DAILY. 60 tablet 1   levETIRAcetam (KEPPRA) 500 MG tablet TAKE 1 TABLET (500 MG TOTAL) BY MOUTH 2 (TWO) TIMES DAILY. 60 tablet 11   thiamine 100 MG tablet TAKE 1 TABLET (100 MG TOTAL) BY MOUTH DAILY. 60 tablet 1   No current facility-administered medications for this visit.     Review of Systems Full ROS  was asked and was negative except for the information on the HPI  Physical Exam Blood pressure 130/85, pulse 95, temperature 98.3 F (36.8 C), temperature source Oral, height 6' (1.829 m), weight 169 lb (76.7 kg), SpO2 94 %. CONSTITUTIONAL: NAD, Alert EYES: Pupils are equal, round, and reactive  to light, Sclera are non-icteric. EOMI, There is chronic surgical and traumatic changes to the Left eyebrow, with large scar, No evidence of active infection or any open wound or draining sinuses. EARS, NOSE, MOUTH AND THROAT: He is wearing a mask. Hearing is intact to voice. Head and NecK: there is a 3.5 cm soft tissue lipoma forehead midline, mobile. LYMPH NODES:  Lymph nodes in the neck are normal. RESPIRATORY:  Lungs are clear. There is normal respiratory effort, with equal breath sounds bilaterally, and without pathologic use of accessory muscles. CARDIOVASCULAR: Heart is regular without murmurs, gallops, or rubs. GI: The abdomen is soft, nontender, and nondistended. There are no palpable masses. There is no hepatosplenomegaly. There are normal bowel sounds in all  quadrants. GU: Rectal deferred.   MUSCULOSKELETAL: Normal muscle strength and tone. No cyanosis or edema.   SKIN: Turgor is good and there are no pathologic skin lesions or ulcers. NEUROLOGIC: Motor and sensation is grossly normal. Cranial nerves are grossly intact. PSYCH:  Oriented to person, place and time. Affect is normal.  Data Reviewed  I have personally reviewed the patient's imaging, laboratory findings and medical records.    Assessment/Plan 45 year old male with history of seizure disorder and prior left orbital trauma soft tissue deformity.  He has left orbital pain that is chronic.  I do not see any active issues and he may require revision by plastic surgery.  I also stated that operating for functional issues such as pain are not necessarily a good option.  Regarding the forehead lipoma I do not think that is contributing to his chronic left orbital pain that was the result of trauma.  Patient was upset and felt that this was not the best use of his time.  I had an extensive discussion with the patient regarding his disease process and my reasoning behind my recommendations.  I will refer him to offer him to refer him back to plastics for potential revision but I have also had very clear expectations regarding the neck pain that he is suffering and he may need to have further assistance with neurology or pain medicine. I will see him on a as needed basis  Time spent with the patient was 45 minutes, with more than 50% of the time spent in face-to-face education, counseling and care coordination.     Caroleen Hamman, MD FACS General Surgeon 05/04/2021, 3:42 PM

## 2021-05-09 ENCOUNTER — Ambulatory Visit (INDEPENDENT_AMBULATORY_CARE_PROVIDER_SITE_OTHER): Payer: Self-pay | Admitting: Neurology

## 2021-05-09 ENCOUNTER — Other Ambulatory Visit: Payer: Self-pay

## 2021-05-09 ENCOUNTER — Encounter: Payer: Self-pay | Admitting: Neurology

## 2021-05-09 VITALS — BP 154/99 | HR 69 | Resp 18 | Ht 72.0 in

## 2021-05-09 DIAGNOSIS — G40009 Localization-related (focal) (partial) idiopathic epilepsy and epileptic syndromes with seizures of localized onset, not intractable, without status epilepticus: Secondary | ICD-10-CM

## 2021-05-09 DIAGNOSIS — R519 Headache, unspecified: Secondary | ICD-10-CM

## 2021-05-09 DIAGNOSIS — R569 Unspecified convulsions: Secondary | ICD-10-CM

## 2021-05-09 DIAGNOSIS — F10239 Alcohol dependence with withdrawal, unspecified: Secondary | ICD-10-CM

## 2021-05-09 MED ORDER — LEVETIRACETAM 500 MG PO TABS
ORAL_TABLET | Freq: Two times a day (BID) | ORAL | 11 refills | Status: DC
  Filled 2021-05-09 – 2021-06-15 (×2): qty 60, 30d supply, fill #0
  Filled 2021-08-16: qty 60, 30d supply, fill #1
  Filled 2021-10-25: qty 60, 30d supply, fill #0
  Filled 2021-12-31: qty 60, 30d supply, fill #1
  Filled 2022-03-01: qty 60, 30d supply, fill #2
  Filled 2022-04-24: qty 60, 30d supply, fill #3

## 2021-05-09 MED ORDER — AMITRIPTYLINE HCL 25 MG PO TABS
ORAL_TABLET | ORAL | 6 refills | Status: DC
  Filled 2021-05-09 – 2021-09-26 (×2): qty 60, 30d supply, fill #0
  Filled 2021-11-29: qty 60, 30d supply, fill #1
  Filled 2022-03-01: qty 60, 30d supply, fill #2
  Filled 2022-04-24: qty 60, 30d supply, fill #3

## 2021-05-09 NOTE — Patient Instructions (Signed)
1.Start amitriptyline 25mg : take 1 tablet every night for 1 week, then increase to 2 tablets every night. If no improvement after 2 weeks of increasing the dose, call our office and we will increase medication further as long as no side effects  2. Continue Keppra 500mg  twice a day  3. Follow-up in 4 months, call for any changes   Seizure Precautions: 1. If medication has been prescribed for you to prevent seizures, take it exactly as directed.  Do not stop taking the medicine without talking to your doctor first, even if you have not had a seizure in a long time.   2. Avoid activities in which a seizure would cause danger to yourself or to others.  Don't operate dangerous machinery, swim alone, or climb in high or dangerous places, such as on ladders, roofs, or girders.  Do not drive unless your doctor says you may.  3. If you have any warning that you may have a seizure, lay down in a safe place where you can't hurt yourself.    4.  No driving for 6 months from last seizure, as per [facility].   Please refer to the following link on the [place] website for more information: http://[email or web address]/[email or web address]/drivingu.cfm   5.  Maintain good sleep hygiene. Avoid alcohol.  6.  Contact your doctor if you have any problems that may be related to the medicine you are taking.  7.  Call 911 and bring the patient back to the ED if:        A.  The seizure lasts longer than 5 minutes.       B.  The patient doesn't awaken shortly after the seizure  C.  The patient has new problems such as difficulty seeing, speaking or moving  D.  The patient was injured during the seizure  E.  The patient has a temperature over 102 F (39C)  F.  The patient vomited and now is having trouble breathing
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
## 2021-05-11 ENCOUNTER — Other Ambulatory Visit: Payer: Self-pay

## 2021-06-07 ENCOUNTER — Telehealth: Payer: Self-pay | Admitting: Neurology

## 2021-06-07 NOTE — Telephone Encounter (Signed)
Pt called in stating the amitriptyline is helping his nerve pain. He is mad he hadn't known about this medication before. He said it is working perfectly.

## 2021-06-15 ENCOUNTER — Other Ambulatory Visit: Payer: Self-pay

## 2021-08-16 ENCOUNTER — Other Ambulatory Visit: Payer: Self-pay

## 2021-09-12 ENCOUNTER — Ambulatory Visit: Payer: Self-pay | Admitting: Neurology

## 2021-09-26 ENCOUNTER — Other Ambulatory Visit: Payer: Self-pay

## 2021-09-27 ENCOUNTER — Other Ambulatory Visit: Payer: Self-pay

## 2021-10-18 ENCOUNTER — Encounter: Payer: Self-pay | Admitting: Neurology

## 2021-10-19 ENCOUNTER — Ambulatory Visit: Payer: Medicaid Other | Admitting: Neurology

## 2021-10-25 ENCOUNTER — Other Ambulatory Visit: Payer: Self-pay

## 2021-10-26 ENCOUNTER — Other Ambulatory Visit: Payer: Self-pay

## 2021-11-29 ENCOUNTER — Other Ambulatory Visit: Payer: Self-pay

## 2021-12-04 ENCOUNTER — Other Ambulatory Visit: Payer: Self-pay

## 2021-12-31 ENCOUNTER — Other Ambulatory Visit: Payer: Self-pay | Admitting: *Deleted

## 2021-12-31 ENCOUNTER — Other Ambulatory Visit: Payer: Self-pay

## 2021-12-31 DIAGNOSIS — R569 Unspecified convulsions: Secondary | ICD-10-CM

## 2021-12-31 NOTE — Telephone Encounter (Signed)
Requested medication (s) are due for refill today: For review ? ?Requested medication (s) are on the active medication list: yes   ? ?Last refill: 05/09/21  #60  11 refills ? ?Future visit scheduled no ? ?Notes to clinic:Historical Provider. Please  review ? ?Requested Prescriptions  ?Pending Prescriptions Disp Refills  ? levETIRAcetam (KEPPRA) 500 MG tablet 60 tablet 11  ?  Sig: TAKE 1 TABLET (500 MG TOTAL) BY MOUTH 2 (TWO) TIMES DAILY.  ?  ? Neurology:  Anticonvulsants - levetiracetam Failed - 12/31/2021 12:22 PM  ?  ?  Failed - Cr in normal range and within 360 days  ?  Creatinine, Ser  ?Date Value Ref Range Status  ?07/21/2020 0.77 0.76 - 1.27 mg/dL Final  ?   ?  ?  Passed - Completed PHQ-2 or PHQ-9 in the last 360 days  ?  ?  Passed - Valid encounter within last 12 months  ?  Recent Outpatient Visits   ? ?      ? 8 months ago Subarachnoid hematoma with loss of consciousness, sequela ([place])  ? [facility] at [facility], Kriste Basque, NP  ? 10 months ago Primary hypertension  ? [place] Elsie Stain, MD  ? 1 year ago Blunt trauma of left eye, subsequent encounter  ? [place] Elsie Stain, MD  ? 1 year ago Subarachnoid hematoma with loss of consciousness, sequela ([place])  ? [facility] at [place], PA-C  ? 1 year ago Alcohol withdrawal seizure with complication [facility])  ? [place] Elsie Stain, MD  ? ?  ?  ? ? ?  ?  ?  ? ? ? ? ?

## 2022-03-01 ENCOUNTER — Other Ambulatory Visit: Payer: Self-pay

## 2022-04-24 ENCOUNTER — Other Ambulatory Visit: Payer: Self-pay

## 2022-07-05 ENCOUNTER — Other Ambulatory Visit: Payer: Self-pay | Admitting: Neurology

## 2022-07-05 ENCOUNTER — Other Ambulatory Visit: Payer: Self-pay

## 2022-07-05 DIAGNOSIS — F10939 Alcohol use, unspecified with withdrawal, unspecified: Secondary | ICD-10-CM

## 2022-07-06 ENCOUNTER — Emergency Department (HOSPITAL_COMMUNITY): Payer: No Typology Code available for payment source

## 2022-07-06 ENCOUNTER — Emergency Department (HOSPITAL_COMMUNITY)
Admission: EM | Admit: 2022-07-06 | Discharge: 2022-07-06 | Disposition: A | Discharge status: Home / Self Care | Payer: No Typology Code available for payment source | Attending: Emergency Medicine | Admitting: Emergency Medicine

## 2022-07-06 ENCOUNTER — Encounter (HOSPITAL_COMMUNITY): Payer: Self-pay | Admitting: Emergency Medicine

## 2022-07-06 ENCOUNTER — Emergency Department (HOSPITAL_BASED_OUTPATIENT_CLINIC_OR_DEPARTMENT_OTHER): Payer: No Typology Code available for payment source

## 2022-07-06 ENCOUNTER — Encounter (HOSPITAL_COMMUNITY): Payer: No Typology Code available for payment source

## 2022-07-06 DIAGNOSIS — M654 Radial styloid tenosynovitis [de Quervain]: Secondary | ICD-10-CM | POA: Insufficient documentation

## 2022-07-06 DIAGNOSIS — S92514A Nondisplaced fracture of proximal phalanx of right lesser toe(s), initial encounter for closed fracture: Secondary | ICD-10-CM | POA: Insufficient documentation

## 2022-07-06 DIAGNOSIS — W19XXXA Unspecified fall, initial encounter: Secondary | ICD-10-CM | POA: Diagnosis not present

## 2022-07-06 DIAGNOSIS — M79632 Pain in left forearm: Secondary | ICD-10-CM | POA: Diagnosis not present

## 2022-07-06 DIAGNOSIS — R52 Pain, unspecified: Secondary | ICD-10-CM | POA: Diagnosis not present

## 2022-07-06 DIAGNOSIS — S92911A Unspecified fracture of right toe(s), initial encounter for closed fracture: Secondary | ICD-10-CM

## 2022-07-06 DIAGNOSIS — S99921A Unspecified injury of right foot, initial encounter: Secondary | ICD-10-CM | POA: Diagnosis present

## 2022-07-06 MED ORDER — IBUPROFEN 600 MG PO TABS
600.0000 mg | ORAL_TABLET | Freq: Four times a day (QID) | ORAL | 0 refills | Status: DC | PRN
  Filled 2022-07-06: qty 30, 8d supply, fill #0

## 2022-07-06 NOTE — Progress Notes (Signed)
VASCULAR LAB    Left upper extremity venous duplex has been performed.  See CV proc for preliminary results.  Messaged negative results to Dr. Pearline Cables via secure chat  Mauro Kaufmann, [facility], RVT 07/06/2022, 2:10 PM

## 2022-07-06 NOTE — ED Notes (Signed)
Patient informed registration he is walking to cafeteria before it closes

## 2022-07-06 NOTE — ED Provider Notes (Signed)
[facility] EMERGENCY DEPARTMENT Provider Note   CSN: [id number] Arrival date & time: 07/06/22  1122     History {Add pertinent medical, surgical, social history, OB history to HPI:1} Chief Complaint  Patient presents with   Arm Injury    Michael Hobbs is a 46 y.o. male.  Patient is a 46 year old male presenting for arm and leg pain.  Patient admits to left forearm pain with radiation to the left thumb over the past several weeks.  Patient denies any eliciting falls or injuries.  States he woke up with the pain 1 morning.  Pain is worse with supination and of the thumb.  Denies any rashes, skin lesions, bruising, or abrasions.  Patient also presents for pain in his right foot.  Patient admits to a fall 2 to 3 weeks ago when he was intoxicated.  States he has pain and swelling in the right foot.  No previous images.  The history is provided by the patient. No language interpreter was used.  Arm Injury Associated symptoms: no back pain and no fever        Home Medications Prior to Admission medications   Medication Sig Start Date End Date Taking? Authorizing Provider  amitriptyline (ELAVIL) 25 MG tablet Take 1 tablet every night for 1 week, then increase to 2 tablets every night 05/09/21   Cameron Sprang, MD  amLODipine (NORVASC) 5 MG tablet Take 1 tablet (5 mg total) by mouth daily. 03/06/21   Elsie Stain, MD  levETIRAcetam (KEPPRA) 500 MG tablet TAKE 1 TABLET (500 MG TOTAL) BY MOUTH 2 (TWO) TIMES DAILY. 05/09/21 05/24/22  Cameron Sprang, MD      Allergies    Patient has no known allergies.    Review of Systems   Review of Systems  Constitutional:  Negative for chills and fever.  HENT:  Negative for ear pain and sore throat.   Eyes:  Negative for pain and visual disturbance.  Respiratory:  Negative for cough and shortness of breath.   Cardiovascular:  Negative for chest pain and palpitations.  Gastrointestinal:  Negative for abdominal pain and  vomiting.  Genitourinary:  Negative for dysuria and hematuria.  Musculoskeletal:  Negative for arthralgias and back pain.  Skin:  Negative for color change and rash.  Neurological:  Negative for seizures and syncope.  All other systems reviewed and are negative.   Physical Exam Updated Vital Signs BP (!) 154/106 (BP Location: Right Arm)   Pulse 96   Temp 98.2 F (36.8 C)   Resp 18   SpO2 98%  Physical Exam Vitals and nursing note reviewed.  Constitutional:      General: He is not in acute distress.    Appearance: He is well-developed.  HENT:     Head: Normocephalic and atraumatic.  Eyes:     Conjunctiva/sclera: Conjunctivae normal.  Cardiovascular:     Rate and Rhythm: Normal rate and regular rhythm.     Heart sounds: No murmur heard. Pulmonary:     Effort: Pulmonary effort is normal. No respiratory distress.     Breath sounds: Normal breath sounds.  Abdominal:     Palpations: Abdomen is soft.     Tenderness: There is no abdominal tenderness.  Musculoskeletal:        General: No swelling.     Cervical back: Neck supple.  Skin:    General: Skin is warm and dry.     Capillary Refill: Capillary refill takes less than 2 seconds.  Neurological:     Mental Status: He is alert.  Psychiatric:        Mood and Affect: Mood normal.     ED Results / Procedures / Treatments   Labs (all labs ordered are listed, but only abnormal results are displayed) Labs Reviewed - No data to display  EKG None  Radiology UE VENOUS DUPLEX (7am - 7pm)  Result Date: 07/06/2022 UPPER VENOUS STUDY  Patient Name:  TOSHIYUKI FREDELL  Date of Exam:   07/06/2022 Medical Rec #: [id number]       Accession #:    [id number] Date of Birth: 04-20-1976        Patient Gender: M Patient Age:   85 years Exam Location:  [facility] Procedure:      VAS [place] UPPER EXTREMITY VENOUS DUPLEX Referring Phys: Campbell Stall --------------------------------------------------------------------------------  Indications:  Pain Comparison Study: No prior study Performing Technologist: Sharion Dove RVS  Examination Guidelines: A complete evaluation includes B-mode imaging, spectral Doppler, color Doppler, and power Doppler as needed of all accessible portions of each vessel. Bilateral testing is considered an integral part of a complete examination. Limited examinations for reoccurring indications may be performed as noted.  Left Findings: +----------+------------+---------+-----------+----------+-------+ LEFT      CompressiblePhasicitySpontaneousPropertiesSummary +----------+------------+---------+-----------+----------+-------+ IJV           Full       Yes       Yes                      +----------+------------+---------+-----------+----------+-------+ Subclavian               Yes       Yes                      +----------+------------+---------+-----------+----------+-------+ Axillary                 Yes       Yes                      +----------+------------+---------+-----------+----------+-------+ Brachial      Full       Yes       Yes                      +----------+------------+---------+-----------+----------+-------+ Radial        Full                                          +----------+------------+---------+-----------+----------+-------+ Ulnar         Full                                          +----------+------------+---------+-----------+----------+-------+ Cephalic      Full                                          +----------+------------+---------+-----------+----------+-------+ Basilic       Full                                          +----------+------------+---------+-----------+----------+-------+  Summary:  Right: No evidence of thrombosis in the subclavian.  Left: No evidence of deep vein thrombosis in the upper extremity. No evidence of superficial vein thrombosis in the upper extremity.  *See table(s) above for measurements and  observations.    Preliminary    DG Foot Complete Right  Result Date: 07/06/2022 CLINICAL DATA:  Right foot and right fourth toe pain EXAM: RIGHT FOOT COMPLETE - 3+ VIEW COMPARISON:  None Available. FINDINGS: Periosteal reaction around the proximal phalanx of the right fourth toe suggests the healing fracture. The remaining visualized bones and joints are unremarkable. IMPRESSION: Periosteal reaction around the proximal phalanx of the fourth toe suggests a healing fracture. Electronically Signed   By: Jacqulynn Cadet M.D.   On: 07/06/2022 12:06   DG Forearm Left  Result Date: 07/06/2022 CLINICAL DATA:  Left forearm pain EXAM: LEFT FOREARM - 2 VIEW COMPARISON:  None Available. FINDINGS: There is no evidence of fracture or other focal bone lesions. Soft tissues are unremarkable. IMPRESSION: Negative. Electronically Signed   By: Jacqulynn Cadet M.D.   On: 07/06/2022 12:04    Procedures Procedures  {Document cardiac monitor, telemetry assessment procedure when appropriate:1}  Medications Ordered in ED Medications - No data to display  ED Course/ Medical Decision Making/ A&P                           Medical Decision Making Amount and/or Complexity of Data Reviewed Radiology: ordered.   66:74 PM  46 year old male presenting for arm and leg pain.  {Document critical care time when appropriate:1} {Document review of labs and clinical decision tools ie heart score, Chads2Vasc2 etc:1}  {Document your independent review of radiology images, and any outside records:1} {Document your discussion with family members, caretakers, and with consultants:1} {Document social determinants of health affecting pt's care:1} {Document your decision making why or why not admission, treatments were needed:1} Final Clinical Impression(s) / ED Diagnoses Final diagnoses:  None    Rx / DC Orders ED Discharge Orders     None

## 2022-07-06 NOTE — ED Triage Notes (Signed)
Patient complains of pain to his left forearm for two two weeks and right foot and right fourth toe pain that started a four months ago. Patient is alert, oriented, ambulating independently with steady gait, and in is in no apparent distress at this time.

## 2022-07-06 NOTE — ED Notes (Signed)
Patient alerts and oriented x4 and ambulatory with steady gait upon leaving the ED. Patient taken all belongings with him when leaving the ED and denies missing anything.

## 2022-07-06 NOTE — Progress Notes (Signed)
Orthopedic Tech Progress Note Patient Details:  Michael Hobbs [birth year] [id number] Velcro thumb spica was applied  Ortho Devices Type of Ortho Device: Thumb spica splint Splint Material: Other (comment) Ortho Device/Splint Location: Left thumb/wrist Ortho Device/Splint Interventions: Application   Post Interventions Patient Tolerated: Well Instructions Provided: Adjustment of device  Carsynn Bethune E Cornesha Radziewicz 07/06/2022, 3:09 PM

## 2022-07-06 NOTE — Discharge Instructions (Signed)
Please call and make an appointment with orthopedic surgery tomorrow for forearm and foot pain.   As requested, a local plastic surgery office has been provided for inquiry on scar revision.

## 2022-07-08 ENCOUNTER — Telehealth: Payer: Self-pay | Admitting: Anesthesiology

## 2022-07-08 ENCOUNTER — Other Ambulatory Visit: Payer: Self-pay

## 2022-07-08 DIAGNOSIS — R569 Unspecified convulsions: Secondary | ICD-10-CM

## 2022-07-08 MED ORDER — LEVETIRACETAM 500 MG PO TABS
ORAL_TABLET | Freq: Two times a day (BID) | ORAL | 0 refills | Status: DC
  Filled 2022-07-08: qty 10, 5d supply, fill #0

## 2022-07-08 NOTE — Telephone Encounter (Signed)
Patient called requesting a refill on his medication: Pt has appointment scheduled for 07/09/2022  1. Which medications need refilled? Keppra 500 mg  2. Which pharmacy/location is medication to be sent to? [facility]   3. Do they need a 30 day or 90 day supply? 90 day supply

## 2022-07-08 NOTE — Telephone Encounter (Signed)
Pt called informed that we are sending in 5 days worth he has to come to his appointment to get more refills ,

## 2022-07-09 ENCOUNTER — Encounter: Payer: Self-pay | Admitting: Neurology

## 2022-07-09 ENCOUNTER — Ambulatory Visit (INDEPENDENT_AMBULATORY_CARE_PROVIDER_SITE_OTHER): Payer: No Typology Code available for payment source | Admitting: Neurology

## 2022-07-09 ENCOUNTER — Other Ambulatory Visit: Payer: Self-pay

## 2022-07-09 VITALS — BP 156/103 | HR 87 | Ht 72.0 in | Wt 171.0 lb

## 2022-07-09 DIAGNOSIS — G40009 Localization-related (focal) (partial) idiopathic epilepsy and epileptic syndromes with seizures of localized onset, not intractable, without status epilepticus: Secondary | ICD-10-CM

## 2022-07-09 DIAGNOSIS — R519 Headache, unspecified: Secondary | ICD-10-CM | POA: Diagnosis not present

## 2022-07-09 MED ORDER — LEVETIRACETAM 500 MG PO TABS
ORAL_TABLET | Freq: Two times a day (BID) | ORAL | 11 refills | Status: DC
  Filled 2022-07-09 – 2022-07-19 (×2): qty 60, 30d supply, fill #0
  Filled 2022-09-16: qty 60, 30d supply, fill #1
  Filled 2022-11-25: qty 60, 30d supply, fill #2

## 2022-07-09 NOTE — Patient Instructions (Signed)
Good to see you.  Continue Keppra (Levetiracetam) '500mg'$  twice a day  2. Let me know when you need the amitriptyline refills  3. Follow-up in 1 year, call for any changes   Seizure Precautions: 1. If medication has been prescribed for you to prevent seizures, take it exactly as directed.  Do not stop taking the medicine without talking to your doctor first, even if you have not had a seizure in a long time.   2. Avoid activities in which a seizure would cause danger to yourself or to others.  Don't operate dangerous machinery, swim alone, or climb in high or dangerous places, such as on ladders, roofs, or girders.  Do not drive unless your doctor says you may.  3. If you have any warning that you may have a seizure, lay down in a safe place where you can't hurt yourself.    4.  No driving for 6 months from last seizure, as per [facility].   Please refer to the following link on the [facility] website for more information: http://[email or web address].org/[email or web address]/driving/drivingu.cfm   5.  Maintain good sleep hygiene. Avoid alcohol.  6.  Contact your doctor if you have any problems that may be related to the medicine you are taking.  7.  Call 911 and bring the patient back to the ED if:        A.  The seizure lasts longer than 5 minutes.       B.  The patient doesn't awaken shortly after the seizure  C.  The patient has new problems such as difficulty seeing, speaking or moving  D.  The patient was injured during the seizure  E.  The patient has a temperature over 102 F (39C)  F.  The patient vomited and now is having trouble breathing
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
## 2022-07-16 ENCOUNTER — Ambulatory Visit: Payer: Self-pay | Admitting: *Deleted

## 2022-07-16 NOTE — Telephone Encounter (Signed)
Patient called agent- requesting referrals: plastic surgery, ortho-pain Patient disconnected before he could be transferred to nurse  Call to patient: Patient has been out of work and has not been able to follow up in office- he is needing RF of medication-referrals are needed after ED visit- needs Orthopedic for wrist. R foot is broke- deformed toe.  Reason for Disposition  [1] Prescription refill request for ESSENTIAL medicine (i.e., likelihood of harm to patient if not taken) AND [2] triager unable to refill per department policy  Answer Assessment - Initial Assessment Questions 1. DRUG NAME: "What medicine do you need to have refilled?"     Amlodipine and amitriptyline     Also requesting referrals 2. REFILLS REMAINING: "How many refills are remaining?" (Note: The label on the medicine or pill bottle will show how many refills are remaining. If there are no refills remaining, then a renewal may be needed.)     none 3. EXPIRATION DATE: "What is the expiration date?" (Note: The label states when the prescription will expire, and thus can no longer be refilled.)     na 4. PRESCRIBING HCP: "Who prescribed it?" Reason: If prescribed by specialist, call should be referred to that group.     [place] 5. SYMPTOMS: "Do you have any symptoms?"     Nerve pain  Protocols used: Medication Refill and Renewal Call-A-AH

## 2022-07-16 NOTE — Telephone Encounter (Signed)
Patient states he has been out of twn for Thanksgiving with his family- but he is back and needs to have follow up with provider. Patient needs RF of Amitriptyline and Amlodipine. Patient has been scheduled for visit in January with provider. Advised patient be seen at mobile unit for immediate needs- location provided and patient states he will go.

## 2022-07-16 NOTE — Telephone Encounter (Signed)
Last OV 02/2021. Labs test last in 01/2021.  Needs an OV.   Agree with disposition to [facility].

## 2022-07-19 ENCOUNTER — Other Ambulatory Visit: Payer: Self-pay

## 2022-07-22 ENCOUNTER — Encounter: Payer: Self-pay | Admitting: Internal Medicine

## 2022-07-22 ENCOUNTER — Ambulatory Visit: Payer: Medicaid Other | Attending: Internal Medicine | Admitting: Internal Medicine

## 2022-07-22 ENCOUNTER — Other Ambulatory Visit: Payer: Self-pay

## 2022-07-22 VITALS — BP 138/90 | HR 99 | Temp 98.4°F | Ht 72.0 in | Wt 170.0 lb

## 2022-07-22 DIAGNOSIS — Z2821 Immunization not carried out because of patient refusal: Secondary | ICD-10-CM

## 2022-07-22 DIAGNOSIS — I10 Essential (primary) hypertension: Secondary | ICD-10-CM | POA: Diagnosis not present

## 2022-07-22 DIAGNOSIS — M79671 Pain in right foot: Secondary | ICD-10-CM

## 2022-07-22 MED ORDER — AMLODIPINE BESYLATE 5 MG PO TABS
5.0000 mg | ORAL_TABLET | Freq: Every day | ORAL | 1 refills | Status: DC
  Filled 2022-07-22: qty 30, 30d supply, fill #0

## 2022-07-22 MED ORDER — DICLOFENAC SODIUM 1 % EX GEL
2.0000 g | Freq: Four times a day (QID) | CUTANEOUS | 0 refills | Status: DC
  Filled 2022-07-22: qty 100, fill #0

## 2022-07-22 NOTE — Progress Notes (Signed)
Patient ID: Michael Hobbs, male    DOB: 27-Dec-1975  MRN: [id number]  CC: Hypertension (HTN f/u./Pain in R foot X1 1/2 weeks. Broken toe X5 months. /)   Subjective: Michael Hobbs is a 46 y.o. male who presents for chronic disease management.  PCP is Dr. Joya Gaskins.  Last saw him 02/2021. His concerns today include:  Patient with history of HTN, subarachnoid hematoma, EtOH use disorder, seizure disorder, tob dep, thrombocytopenia  Needing Rxn for Norvasc.  Never filled past rxn Has BP device at home Not limiting salty foods. No CP/SOB/LE  Sz ds:  taking Keppra as prescribed by his neurologist  C/o pain RT lateral foot x 1.5 wks Patient states he was drunk and fell about 5 months ago and fractured his right fourth toe.  That has since healed with this toe being shorter than it was previously and not bothersome.  However he started having pain in the lateral aspect of the foot 1-1/2 weeks ago.  No known initiating factors.  He works Web designer.  He has to walk favoring that side of his foot.  Seen in the emergency room 07/06/2022 for the same.  X-ray showed healing fracture of the right fourth toe and otherwise negative.  Prescribed ibuprofen but he never picked it up.  Out of work x 2 weeks.   HM:  declines flu shot  Patient Active Problem List   Diagnosis Date Noted   Tobacco use 03/06/2021   Lipoma 01/12/2021   Foreign body (FB) in soft tissue 01/12/2021   PTSD (post-traumatic stress disorder) 11/30/2020   HTN (hypertension) 09/21/2020   Seizures ([place]) 07/25/2020   History of alcohol abuse 12/01/2012     Current Outpatient Medications on File Prior to Visit  Medication Sig Dispense Refill   levETIRAcetam (KEPPRA) 500 MG tablet TAKE 1 TABLET (500 MG TOTAL) BY MOUTH 2 (TWO) TIMES DAILY. 60 tablet 11   amitriptyline (ELAVIL) 25 MG tablet Take 1 tablet every night for 1 week, then increase to 2 tablets every night (Patient not taking: Reported on 07/22/2022)  60 tablet 6   ibuprofen (ADVIL) 600 MG tablet Take 1 tablet (600 mg total) by mouth every 6 (six) hours as needed for mild pain. (Patient not taking: Reported on 07/22/2022) 30 tablet 0   No current facility-administered medications on file prior to visit.    No Known Allergies  Social History   Socioeconomic History   Marital status: Single    Spouse name: Not on file   Number of children: Not on file   Years of education: Not on file   Highest education level: Not on file  Occupational History   Not on file  Tobacco Use   Smoking status: Every Day    Packs/day: 0.25    Types: Cigars, Cigarettes   Smokeless tobacco: Never   Tobacco comments:    unable to afford at this time/ cigar 3 daily  Vaping Use   Vaping Use: Never used  Substance and Sexual Activity   Alcohol use: Yes    Comment: 1 pint of beer, and a 1/4 pint of liquor daily   Drug use: No   Sexual activity: Not Currently  Other Topics Concern   Not on file  Social History Narrative   Pt lives in single story home with his mother, father and nephew   Has BS from [employer]   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 Left     ROS: Review of Systems Negative except as stated above  PHYSICAL EXAM: BP (!) 138/90   Pulse 99   Temp 98.4 F (36.9 C) (Oral)   Ht 6' (1.829 m)   Wt 170 lb (77.1 kg)   SpO2 96%   BMI 23.06 kg/m   Physical Exam Repeat BP 138/90  General appearance  -middle-aged African-American male in NAD. Mental status - normal mood, behavior, speech, dress, motor activity, and thought processes Chest - clear to auscultation, no wheezes, rales or rhonchi, symmetric air entry Heart - normal rate, regular rhythm, normal S1, S2, no murmurs, rubs, clicks or gallops Musculoskeletal -right foot: Right fourth toe is shorter compared to the 1 on the left.  No tenderness on palpation.  He has mild tenderness with bony overgrowth on the lateral midfoot.  The bony overgrowth is symmetric to what is seen on the left foot.  No edema or erythema noted. Extremities -no lower extremity edema.     Latest Ref Rng & Units 07/21/2020   10:35 AM 07/10/2020    4:20 PM 05/23/2020   11:33 AM  CMP  Glucose 65 - 99 mg/dL 77  120  132   BUN 6 - 24 mg/dL 7  5  <5   Creatinine 0.76 - 1.27 mg/dL 0.77  1.14  1.14   Sodium 134 - 144 mmol/L 141  135  134   Potassium 3.5 - 5.2 mmol/L 4.1  3.4  3.7   Chloride 96 - 106 mmol/L 100  97  95   CO2 20 - 29 mmol/L '24  20  23   '$ Calcium 8.7 - 10.2 mg/dL 9.2  9.2  9.3   Total Protein 6.5 - 8.1 g/dL  7.7  7.3   Total Bilirubin 0.3 - 1.2 mg/dL  0.9  1.3   Alkaline Phos 38 - 126 U/L  44  48   AST 15 - 41 U/L  64  89   ALT 0 - 44 U/L  24  28    Lipid Panel     Component Value Date/Time   CHOL 213 (H) 07/21/2020 1035   TRIG 126 07/21/2020 1035   HDL 61 07/21/2020 1035   CHOLHDL 3.5 07/21/2020 1035   LDLCALC 130 (H) 07/21/2020 1035    CBC    Component Value Date/Time   WBC 6.1 02/06/2021 1645   WBC 4.5 07/10/2020 1620   RBC 4.38 02/06/2021 1645   RBC 3.71 (L) 07/10/2020 1620   HGB 16.4 02/06/2021 1645   HCT 45.5 02/06/2021 1645   PLT 184 02/06/2021 1645   MCV 104 (H) 02/06/2021 1645   MCH 37.4 (H) 02/06/2021 1645   MCH 36.4 (H) 07/10/2020 1620   MCHC 36.0 (H) 02/06/2021 1645   MCHC 33.3 07/10/2020 1620   RDW 12.2 02/06/2021 1645   LYMPHSABS 2.0 02/06/2021 1645   MONOABS 0.5 07/10/2020 1620   EOSABS 0.1 02/06/2021 1645  BASOSABS 0.0 02/06/2021 1645    ASSESSMENT AND PLAN:  1. Essential hypertension Not at goal. DASH diet discussed and encouraged. Start amlodipine 5 mg daily.  2. Foot pain, right Of questionable etiology.  Referral given to podiatry.  Prescription given for Voltaren gel. - Ambulatory referral to Podiatry  3. Influenza vaccination declined Recommended.  Patient declined.    Patient was given the opportunity to ask questions.  Patient verbalized understanding of the plan and was able to repeat key elements of the plan.   This documentation was completed using Radio producer.  Any transcriptional errors are unintentional.  Orders Placed This Encounter  Procedures   Ambulatory referral to Podiatry     Requested Prescriptions   Signed Prescriptions Disp Refills   amLODipine (NORVASC) 5 MG tablet 30 tablet 1    Sig: Take 1 tablet (5 mg total) by mouth daily.   diclofenac Sodium (VOLTAREN) 1 % GEL 100 g 0    Sig: Apply 2 g topically 4 (four) times daily.    No follow-ups on file.  Karle Plumber, MD, FACP

## 2022-07-26 ENCOUNTER — Other Ambulatory Visit: Payer: Self-pay

## 2022-07-29 ENCOUNTER — Ambulatory Visit (INDEPENDENT_AMBULATORY_CARE_PROVIDER_SITE_OTHER): Payer: No Typology Code available for payment source | Admitting: Podiatry

## 2022-07-29 DIAGNOSIS — S92514A Nondisplaced fracture of proximal phalanx of right lesser toe(s), initial encounter for closed fracture: Secondary | ICD-10-CM

## 2022-07-29 DIAGNOSIS — M79671 Pain in right foot: Secondary | ICD-10-CM

## 2022-07-29 DIAGNOSIS — R200 Anesthesia of skin: Secondary | ICD-10-CM

## 2022-07-29 NOTE — Progress Notes (Signed)
Subjective:   Patient ID: Michael Hobbs, male   DOB: 46 y.o.   MRN: [id number]   HPI Chief Complaint  Patient presents with   Fracture    Patient came in today for a toe fracture on the right foot 4th toe, patient is now having pain lateral side of the foot, patient is having numbness in the toe, rate of pain 7 out of 10, X-Rays done today     46 year old male presents the office for above concerns.  He broke his toe about 5.5 months ago after he hit it.  He states the toe is still causing discomfort and having numbness in the toe.  Redness seems to be going up to the foot as well.  No recent treatment he reports.   Review of Systems  All other systems reviewed and are negative.  Past Medical History:  Diagnosis Date   Alcohol abuse    fell in 2006 and had "brain bleed"   HTN (hypertension) 09/21/2020   Nasal fracture 12/01/2012   Periorbital contusion of left eye 12/01/2012   PNA (pneumonia) 10/02/2018   Seizures ([place])    Subarachnoid hematoma ([place]) 12/01/2012   Syncope 12/01/2012   Thrombocytopenia ([place]) 09/21/2020    Past Surgical History:  Procedure Laterality Date   HERNIA REPAIR     right shoulder surgery. Left      Current Outpatient Medications:    amitriptyline (ELAVIL) 25 MG tablet, Take 1 tablet every night for 1 week, then increase to 2 tablets every night (Patient not taking: Reported on 07/22/2022), Disp: 60 tablet, Rfl: 6   amLODipine (NORVASC) 5 MG tablet, Take 1 tablet (5 mg total) by mouth daily., Disp: 30 tablet, Rfl: 1   diclofenac Sodium (VOLTAREN) 1 % GEL, Apply 2 g topically 4 (four) times daily., Disp: 100 g, Rfl: 0   ibuprofen (ADVIL) 600 MG tablet, Take 1 tablet (600 mg total) by mouth every 6 (six) hours as needed for mild pain. (Patient not taking: Reported on 07/22/2022), Disp: 30 tablet, Rfl: 0   levETIRAcetam (KEPPRA) 500 MG tablet, TAKE 1 TABLET (500 MG TOTAL) BY MOUTH 2 (TWO) TIMES DAILY., Disp: 60 tablet, Rfl: 11  No Known Allergies         Objective:  Physical Exam  General: AAO x3, NAD  Dermatological: Skin is warm, dry and supple bilateral. There are no open sores, no preulcerative lesions, no rash or signs of infection present.  Vascular: Dorsalis Pedis artery and Posterior Tibial artery pedal pulses are 2/4 bilateral with immedate capillary fill time. There is no pain with calf compression, swelling, warmth, erythema.   Neruologic: Grossly intact via light touch bilateral.  Negative Tinel sign.  Musculoskeletal: Not able to appreciate any significant area of tenderness.  He is describing more of numbness the toes going up into the foot.  Numbness seems to be going midway up the foot.  Muscular strength 5/5 in all groups tested bilateral.  Gait: Unassisted, Nonantalgic.       Assessment:   Persistent numbness status post injury right foot     Plan:  -Treatment options discussed including all alternatives, risks, and complications -Etiology of symptoms were discussed -X-rays were obtained reviewed.  3 views of the foot were obtained.  Evidence of fracture on the proximal phalanx with bone callus present. -I do think his numbness is related to the injury but seems to be worsening point of the foot.  She does have ordered nerve conduction test show other causes of  neuropathy, numbness.  Trula Slade DPM

## 2022-08-06 ENCOUNTER — Other Ambulatory Visit: Payer: Self-pay

## 2022-08-06 DIAGNOSIS — S92514A Nondisplaced fracture of proximal phalanx of right lesser toe(s), initial encounter for closed fracture: Secondary | ICD-10-CM

## 2022-08-06 DIAGNOSIS — R200 Anesthesia of skin: Secondary | ICD-10-CM

## 2022-08-14 ENCOUNTER — Telehealth: Payer: Self-pay | Admitting: Physician Assistant

## 2022-08-14 NOTE — Telephone Encounter (Signed)
error 

## 2022-08-22 ENCOUNTER — Telehealth: Payer: Self-pay

## 2022-08-22 ENCOUNTER — Encounter: Payer: Self-pay | Admitting: Plastic Surgery

## 2022-08-22 ENCOUNTER — Ambulatory Visit (INDEPENDENT_AMBULATORY_CARE_PROVIDER_SITE_OTHER): Payer: Medicaid Other | Admitting: Plastic Surgery

## 2022-08-22 VITALS — BP 158/103 | HR 73 | Ht 72.0 in | Wt 177.4 lb

## 2022-08-22 DIAGNOSIS — L905 Scar conditions and fibrosis of skin: Secondary | ICD-10-CM

## 2022-08-22 DIAGNOSIS — S0512XS Contusion of eyeball and orbital tissues, left eye, sequela: Secondary | ICD-10-CM

## 2022-08-22 DIAGNOSIS — D170 Benign lipomatous neoplasm of skin and subcutaneous tissue of head, face and neck: Secondary | ICD-10-CM

## 2022-08-22 NOTE — Telephone Encounter (Signed)
Called pt to adv of Dr. Tanna Furry message, na, left vm with call back #.

## 2022-08-22 NOTE — Telephone Encounter (Signed)
-----   Message from Camillia Herter, MD sent at 08/22/2022  1:23 PM EST ----- Regarding: CT Scan Michael Hobbs,  I saw Mr Chermak today regarding a facial scar and forehead lipoma. I told him I was going to order a CT scan but he has had one done already.  Will you please let him know that he does not need the scan and that I have already submitted his surgical request?  Thank you. Barnabas Lister

## 2022-08-22 NOTE — Progress Notes (Signed)
Referring Provider No referring provider defined for this encounter.   CC:  Chief Complaint  Patient presents with   Advice Only      Michael Hobbs is an 47 y.o. male.  HPI: Michael Hobbs is a 47 year old male who has been previously seen in the clinic regarding pain and swelling over his left thigh secondary to trauma.  Today with continued concerns 1 regarding the appearance of his scar, 2 complaints of persistent swelling over the left thigh which affects his vision, and 3 pain related to the trauma and his scar.  Additionally he is unhappy about a small lipoma on his forehead that has been there for several years.  No Known Allergies  Outpatient Encounter Medications as of 08/22/2022  Medication Sig   levETIRAcetam (KEPPRA) 500 MG tablet TAKE 1 TABLET (500 MG TOTAL) BY MOUTH 2 (TWO) TIMES DAILY.   amitriptyline (ELAVIL) 25 MG tablet Take 1 tablet every night for 1 week, then increase to 2 tablets every night (Patient not taking: Reported on 08/22/2022)   amLODipine (NORVASC) 5 MG tablet Take 1 tablet (5 mg total) by mouth daily. (Patient not taking: Reported on 08/22/2022)   [DISCONTINUED] diclofenac Sodium (VOLTAREN) 1 % GEL Apply 2 g topically 4 (four) times daily. (Patient not taking: Reported on 08/22/2022)   [DISCONTINUED] ibuprofen (ADVIL) 600 MG tablet Take 1 tablet (600 mg total) by mouth every 6 (six) hours as needed for mild pain. (Patient not taking: Reported on 07/22/2022)   No facility-administered encounter medications on file as of 08/22/2022.     Past Medical History:  Diagnosis Date   Alcohol abuse    fell in 2006 and had "brain bleed"   HTN (hypertension) 09/21/2020   Nasal fracture 12/01/2012   Periorbital contusion of left eye 12/01/2012   PNA (pneumonia) 10/02/2018   Seizures ([place])    Subarachnoid hematoma ([place]) 12/01/2012   Syncope 12/01/2012   Thrombocytopenia ([place]) 09/21/2020    Past Surgical History:  Procedure Laterality Date   HERNIA REPAIR     right shoulder  surgery. 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  Visual disturbance secondary to swelling of the soft tissues in the periorbital region.  Less on the forehead in the frontal region.  No difficulty with function of the eyelid no malocclusion.  Physical Exam    08/22/2022    9:07 AM 07/22/2022    3:08 PM 07/22/2022    2:34 PM  Vitals with BMI  Height '6\' 0"'$   '6\' 0"'$   Weight 177 lbs 6 oz  170 lbs  BMI 77.41  28.78  Systolic 676 720 947  Diastolic 096 90 93  Pulse 73  99    General:  No acute distress,  Alert and oriented, Non-Toxic, Normal speech and affect Craniofacial: Patient has a well-healed irregular scar that goes through the left eyebrow and extends down the lateral orbital rim.  He has  marked periorbital swelling.  Extra ocular movements are intact as is lid function.  There is a soft tissue mass just to the left of midline in the frontal region consistent with a lipoma.   Assessment/Plan:  Previous CT scans reviewed. Craniofacial trauma: Patient has a scar from a previous fall he sustained after having a seizure.  He still has marked periorbital swelling  as a result of the fall despite despite the trauma being 2 years ago.  I have very clearly discussed with him the fact that surgery will not fix his pain.  If his only concern is improvement of the pain then surgery is not an option.  He states that he understands this and what he would like his for the scar to be revised to  make it a bit more aesthetically acceptable and to try to elevate his left eyelid so that he does not have is much visual disturbance due to the periorbital swelling. Soft tissue mass: A previous CT scan showed that this was a stable lipoma.  This can be excised at the time of his scar revision.  Will submit and schedule for scar revision and removal of subcutaneous mass  Camillia Herter 08/22/2022, 1:05 PM

## 2022-08-26 ENCOUNTER — Encounter: Payer: Self-pay | Admitting: Neurology

## 2022-09-12 ENCOUNTER — Telehealth: Payer: Self-pay

## 2022-09-12 ENCOUNTER — Ambulatory Visit (INDEPENDENT_AMBULATORY_CARE_PROVIDER_SITE_OTHER): Payer: Commercial Managed Care - HMO | Admitting: Neurology

## 2022-09-12 DIAGNOSIS — R200 Anesthesia of skin: Secondary | ICD-10-CM | POA: Diagnosis not present

## 2022-09-12 NOTE — Telephone Encounter (Signed)
Error

## 2022-09-12 NOTE — Procedures (Signed)
  [facility]  [employer] [place], [place]  [place], [place] [address] Tel: [phone] Fax: [phone] Test Date:  09/12/2022  Patient: Michael Hobbs DOB: 1975/10/03 Physician: Narda Amber, DO  Sex: Male Height: '6\' 0"'$  Ref Phys: Celesta Gentile, DPM  ID#: [id number]   Technician:    History: This is a 47 year old man referred for evaluation of right foot numbness.  NCV & EMG Findings: Extensive electrodiagnostic testing of the right lower extremity shows:  Right sural and superficial peroneal sensory responses are within normal limits. Right peroneal and tibial motor responses are within normal limits. Right tibial H reflex study is within normal limits. There is no evidence of active or chronic motor axonal loss changes affecting any of the tested muscles.  Motor unit configuration and recruitment pattern is within normal limits.  Impression: This is a normal study of the right lower extremity.  In particular, there is no evidence of a large fiber sensorimotor polyneuropathy or lumbosacral radiculopathy.   ___________________________ Narda Amber, DO    Nerve Conduction Studies   Stim Site NR Peak (ms) Norm Peak (ms) O-P Amp (V) Norm O-P Amp  Right Sup Peroneal Anti Sensory (Ant Lat Mall)  32 C  12 cm    2.3 <4.5 14.5 >5  Right Sural Anti Sensory (Lat Mall)  32 C  Calf    3.4 <4.5 8.0 >5     Stim Site NR Onset (ms) Norm Onset (ms) O-P Amp (mV) Norm O-P Amp Site1 Site2 Delta-0 (ms) Dist (cm) Vel (m/s) Norm Vel (m/s)  Right Peroneal Motor (Ext Dig Brev)  32 C  Ankle    2.6 <5.5 3.9 >3 B Fib Ankle 8.4 42.0 50 >40  B Fib    11.0  3.8  Poplt B Fib 1.7 10.0 59 >40  Poplt    12.7  3.8         Right Tibial Motor (Abd Hall Brev)  32 C  Ankle    4.0 <6.0 10.8 >8 Knee Ankle 9.4 44.0 47 >40  Knee    13.4  8.1          Electromyography   Side Muscle Ins.Act Fibs Fasc Recrt Amp Dur Poly Activation Comment  Right AntTibialis Nml Nml Nml Nml Nml Nml Nml Nml N/A   Right Gastroc Nml Nml Nml Nml Nml Nml Nml Nml N/A  Right Flex Dig Long Nml Nml Nml Nml Nml Nml Nml Nml N/A  Right RectFemoris Nml Nml Nml Nml Nml Nml Nml Nml N/A  Right BicepsFemS Nml Nml Nml Nml Nml Nml Nml Nml N/A  Right GluteusMed Nml Nml Nml Nml Nml Nml Nml Nml N/A      Waveforms:

## 2022-09-16 ENCOUNTER — Other Ambulatory Visit: Payer: Self-pay

## 2022-09-17 NOTE — Progress Notes (Deleted)
Established Patient Office Visit  Subjective   Patient ID: Michael Hobbs, male    DOB: 1976-04-05  Age: 47 y.o. MRN: [id number]  No chief complaint on file.   47 y.o.M HTN not seen since 02/2021 Saw Dr Wynetta Emery 07/2022 Expand All Collapse All      Patient ID: Michael Hobbs, male    DOB: 1975-09-11  MRN: [id number]   CC: Hypertension (HTN f/u./Pain in R foot X1 1/2 weeks. Broken toe X5 months. /)     Subjective: Michael Hobbs is a 47 y.o. male who presents for chronic disease management.  PCP is Dr. Joya Gaskins.  Last saw him 02/2021. His concerns today include:  Patient with history of HTN, subarachnoid hematoma, EtOH use disorder, seizure disorder, tob dep, thrombocytopenia   Needing Rxn for Norvasc.  Never filled past rxn Has BP device at home Not limiting salty foods. No CP/SOB/LE   Sz ds:  taking Keppra as prescribed by his neurologist   C/o pain RT lateral foot x 1.5 wks Patient states he was drunk and fell about 5 months ago and fractured his right fourth toe.  That has since healed with this toe being shorter than it was previously and not bothersome.  However he started having pain in the lateral aspect of the foot 1-1/2 weeks ago.  No known initiating factors.  He works Web designer.  He has to walk favoring that side of his foot.  Seen in the emergency room 07/06/2022 for the same.  X-ray showed healing fracture of the right fourth toe and otherwise negative.  Prescribed ibuprofen but he never picked it up.  Out of work x 2 weeks.     HM:  declines flu shot    Patient Active Problem List   Diagnosis Date Noted  Tobacco use 03/06/2021  Lipoma 01/12/2021  Foreign body (FB) in soft tissue 01/12/2021  PTSD (post-traumatic stress disorder) 11/30/2020  HTN (hypertension) 09/21/2020  Seizures ([place]) 07/25/2020  History of alcohol abuse 12/01/2012      Current Outpatient Medications on File Prior to  Visit Medication Sig Dispense Refill  levETIRAcetam (KEPPRA) 500 MG tablet TAKE 1 TABLET (500 MG TOTAL) BY MOUTH 2 (TWO) TIMES DAILY. 60 tablet 11  amitriptyline (ELAVIL) 25 MG tablet Take 1 tablet every night for 1 week, then increase to 2 tablets every night (Patient not taking: Reported on 07/22/2022) 60 tablet 6  ibuprofen (ADVIL) 600 MG tablet Take 1 tablet (600 mg total) by mouth every 6 (six) hours as needed for mild pain. (Patient not taking: Reported on 07/22/2022) 30 tablet 0    No current facility-administered medications on file prior to visit.     No Known Allergies    Social History    Socioeconomic History  Marital status: Single     Spouse name: Not on file  Number of children: Not on file  Years of education: Not on file  Highest education level: Not on file Occupational History  Not on file Tobacco Use  Smoking status: Every Day     Packs/day: 0.25     Types: Cigars, Cigarettes  Smokeless tobacco: Never  Tobacco comments:     unable to afford at this time/ cigar 3 daily Vaping Use  Vaping Use: Never used Substance and Sexual Activity  Alcohol use: Yes     Comment: 1 pint of beer, and a 1/4 pint of liquor daily  Drug use: No  Sexual activity: Not Currently Other Topics Concern  Not on file Social History Narrative   Pt lives in single story home with his mother, father and nephew   Has BS from [employer]   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 Left       ROS: Review of Systems Negative except as stated above   PHYSICAL EXAM: BP (!) 138/90   Pulse 99   Temp 98.4 F (36.9 C) (Oral)   Ht 6' (1.829 m)   Wt 170 lb (77.1 kg)   SpO2 96%   BMI 23.06 kg/m   Physical Exam Repeat BP 138/90   General appearance -middle-aged African-American male in NAD. Mental status - normal mood, behavior, speech, dress, motor activity, and thought processes Chest - clear to auscultation, no wheezes, rales or rhonchi, symmetric air entry Heart - normal rate, regular rhythm, normal S1, S2, no murmurs, rubs, clicks or gallops Musculoskeletal -right foot: Right fourth toe is shorter compared to the 1 on the left.  No tenderness on palpation.  He has mild tenderness with bony overgrowth on the lateral midfoot.  The bony overgrowth is symmetric to what is seen on the left foot.  No edema or erythema noted. Extremities -no lower extremity edema.        Latest Ref Rng & Units 07/21/2020   10:35 AM 07/10/2020    4:20 PM 05/23/2020   11:33 AM CMP Glucose 65 - 99 mg/dL 77  120  132  BUN 6 - 24 mg/dL 7  5  <5  Creatinine 0.76 - 1.27 mg/dL 0.77  1.14  1.14  Sodium 134 - 144 mmol/L 141  135  134  Potassium 3.5 - 5.2 mmol/L 4.1  3.4  3.7  Chloride 96 - 106 mmol/L 100  97  95  CO2 20 - 29 mmol/L '24  20  23  '$ Calcium 8.7 - 10.2 mg/dL 9.2  9.2  9.3  Total Protein 6.5 - 8.1 g/dL   7.7  7.3  Total Bilirubin 0.3 - 1.2 mg/dL   0.9  1.3  Alkaline Phos 38 - 126 U/L   44  48  AST 15 - 41 U/L   64  89  ALT 0 - 44 U/L   24  28    Lipid Panel   Labs (Brief)   Component Value Date/Time   CHOL 213 (H) 07/21/2020 1035   TRIG 126 07/21/2020 1035   HDL 61 07/21/2020 1035   CHOLHDL 3.5 07/21/2020 1035   LDLCALC 130 (H) 07/21/2020 1035      CBC  Labs (Brief)   Component Value Date/Time   WBC 6.1 02/06/2021 1645   WBC 4.5 07/10/2020 1620   RBC 4.38 02/06/2021 1645    RBC 3.71 (L) 07/10/2020 1620   HGB 16.4 02/06/2021 1645   HCT 45.5 02/06/2021 1645   PLT 184 02/06/2021 1645   MCV 104 (H) 02/06/2021 1645   MCH 37.4 (H) 02/06/2021  1645   MCH 36.4 (H) 07/10/2020 1620   MCHC 36.0 (H) 02/06/2021 1645   MCHC 33.3 07/10/2020 1620   RDW 12.2 02/06/2021 1645   LYMPHSABS 2.0 02/06/2021 1645   MONOABS 0.5 07/10/2020 1620   EOSABS 0.1 02/06/2021 1645   BASOSABS 0.0 02/06/2021 1645      ASSESSMENT AND PLAN:   1. Essential hypertension Not at goal. DASH diet discussed and encouraged. Start amlodipine 5 mg daily.   2. Foot pain, right Of questionable etiology.  Referral given to podiatry.  Prescription given for Voltaren gel. - Ambulatory referral to Podiatry   3. Influenza vaccination declined Recommended.  Patient declined.       {History (Optional):23778}  ROS    Objective:     There were no vitals taken for this visit. {Vitals History (Optional):23777}  Physical Exam   No results found for any visits on 09/18/22.  {Labs (Optional):23779}  The 10-year ASCVD risk score (Arnett DK, et al., 2019) is: 16.1%    Assessment & Plan:   Problem List Items Addressed This Visit   None   No follow-ups on file.    Asencion Noble, MD

## 2022-09-18 ENCOUNTER — Telehealth: Payer: Self-pay | Admitting: Podiatry

## 2022-09-18 ENCOUNTER — Ambulatory Visit: Payer: No Typology Code available for payment source | Admitting: Critical Care Medicine

## 2022-09-18 NOTE — Telephone Encounter (Signed)
Left message for pt to call to schedule an appt to go over results with Dr Jacqualyn Posey.Marland Kitchen

## 2022-09-18 NOTE — Telephone Encounter (Signed)
-----  Message from Trula Slade, DPM sent at 09/13/2022  7:14 AM EST ----- Can you please schedule a follow up at his convience. I have sent him a message with the results. Thanks.

## 2022-09-20 ENCOUNTER — Other Ambulatory Visit: Payer: Self-pay

## 2022-09-26 ENCOUNTER — Encounter: Payer: No Typology Code available for payment source | Admitting: Neurology

## 2022-09-27 ENCOUNTER — Other Ambulatory Visit: Payer: Self-pay

## 2022-09-27 ENCOUNTER — Telehealth: Payer: Self-pay | Admitting: Plastic Surgery

## 2022-09-27 NOTE — Telephone Encounter (Signed)
[facility] READY TO SCHEDULE SURGERY - NEED NEUROLOGY CLEARANCE - ALSO WANTS AFTERNOON SURGERY DUE TO WORK

## 2022-10-01 IMAGING — MR MR HEAD WO/W CM
14 of 16 series · 40 of 48 positions shown · IV contrast (Gadavist)
Comparison: Head CT 07/10/2020 and MRI 10/14/2018

CLINICAL DATA: Seizure.  Remote history of subarachnoid hemorrhage.

EXAM:
MRI HEAD WITHOUT AND WITH CONTRAST
TECHNIQUE: Multiplanar, multiecho pulse sequences of the brain and surrounding
structures were obtained without and with intravenous contrast.
CONTRAST:  7.5 mL Gadavist

[Series 5: DWI · axial · 3.0mm · 0.96mm/px · z∈[-110,+43]mm · 6 of 104 slices shown (1 of 4)]
[im 1/104]
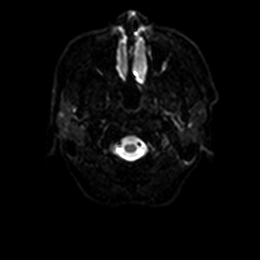
[im 21/104]
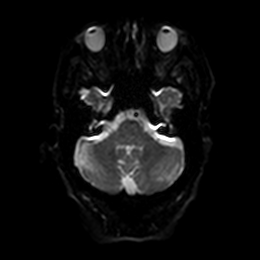
[im 42/104]
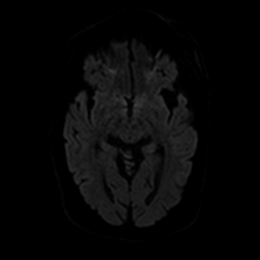
[im 62/104]
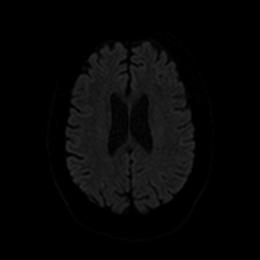
[im 83/104]
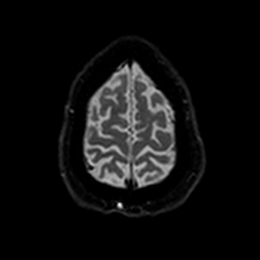
[im 104/104]
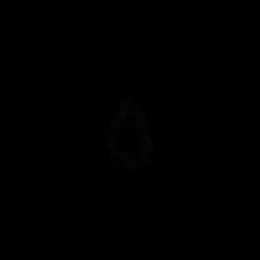

[Series 6: DWI · axial · 3.0mm · 0.96mm/px · z∈[-110,+43]mm · 2 of 52 slices shown (2 of 4)]
[im 1/52]
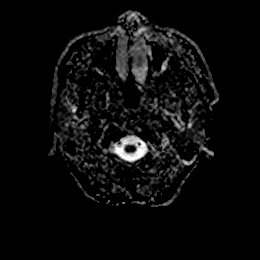
[im 52/52]
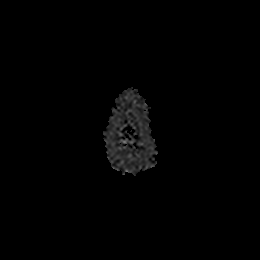

[Series 7: DWI · coronal · 4.0mm · 0.88mm/px · 5 of 76 slices shown (3 of 4)]
[im 1/76]
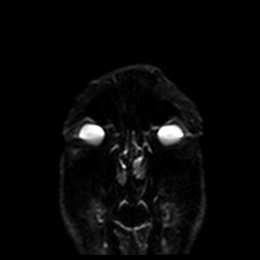
[im 19/76]
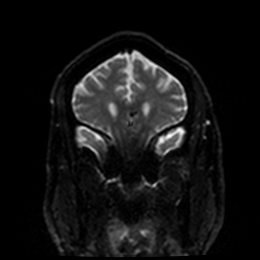
[im 38/76]
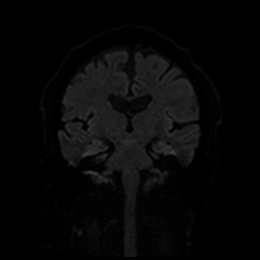
[im 57/76]
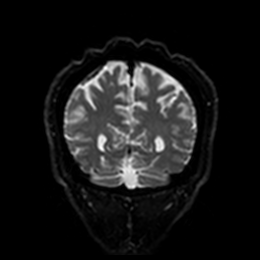
[im 76/76]
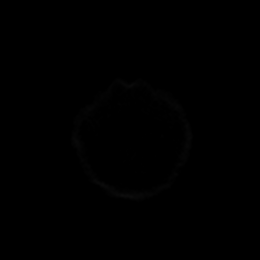

[Series 8: DWI · coronal · 4.0mm · 0.88mm/px · 3 of 38 slices shown (4 of 4)]
[im 1/38]
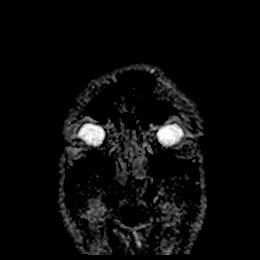
[im 19/38]
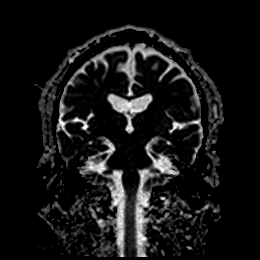
[im 38/38]
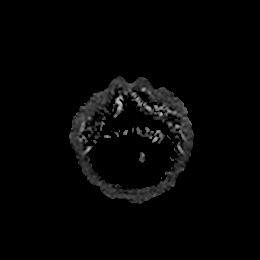

[Series 9: T1 · sagittal · 5.0mm · 0.78mm/px · 2 of 25 slices shown]
[im 1/25]
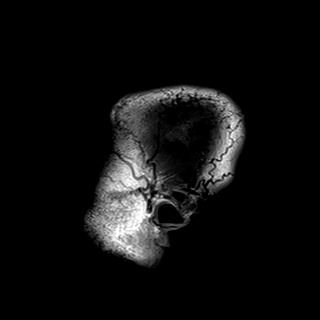
[im 25/25]
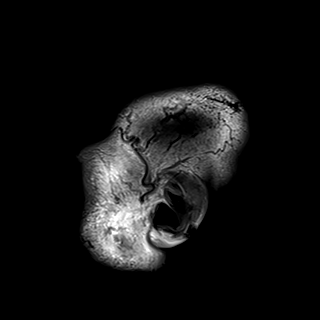

[Series 10: T2 · axial · 5.0mm · 0.72mm/px · z∈[-105,+39]mm · 2 of 25 slices shown (1 of 2)]
[im 1/25]
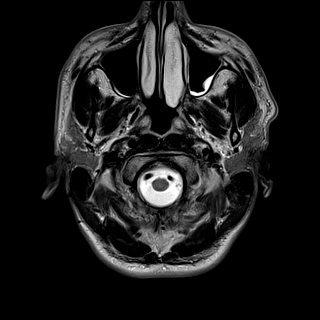
[im 25/25]
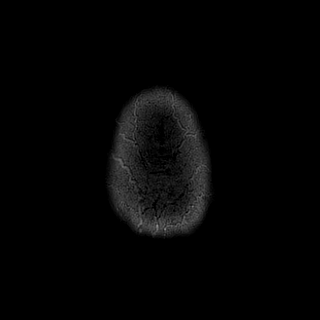

[Series 11: FLAIR · axial · 5.0mm · 0.45mm/px · z∈[-104,+40]mm · 2 of 25 slices shown (1 of 2)]
[im 1/25]
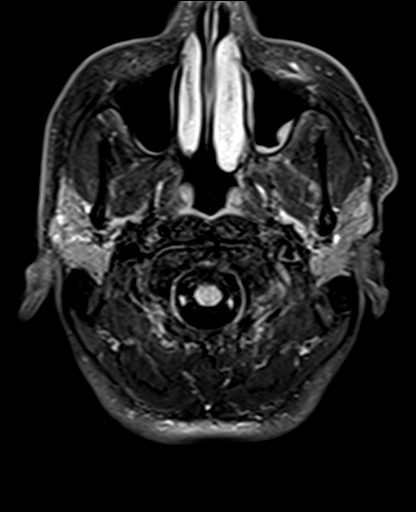
[im 25/25]
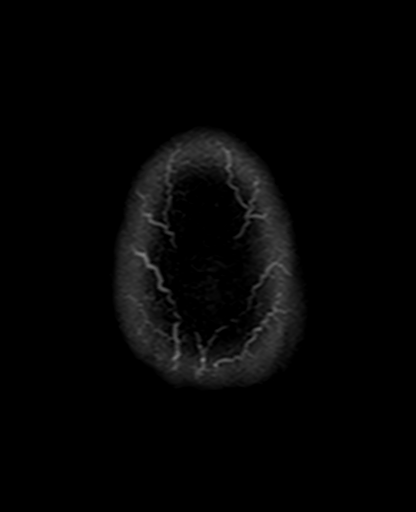

[Series 13: pha_images · axial · 3.0mm · 0.90mm/px · z∈[-121,+53]mm · 4 of 59 slices shown]
[im 1/59]
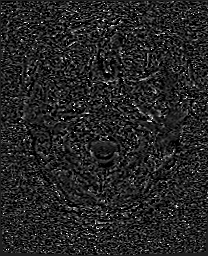
[im 20/59]
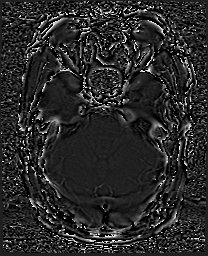
[im 39/59]
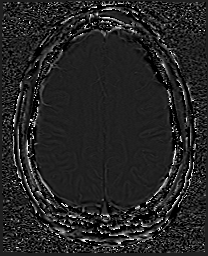
[im 59/59]
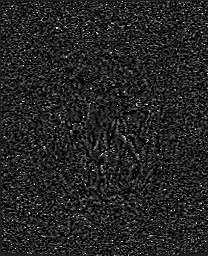

[Series 14: swi_images · axial · 3.0mm · 0.90mm/px · z∈[-121,+56]mm · 4 of 60 slices shown]
[im 1/60]
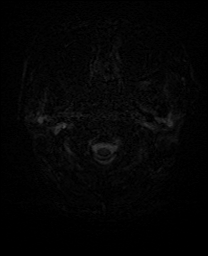
[im 20/60]
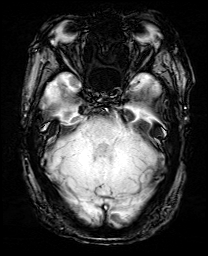
[im 40/60]
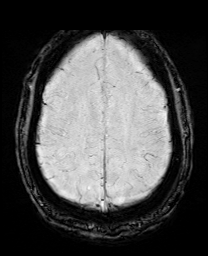
[im 60/60]
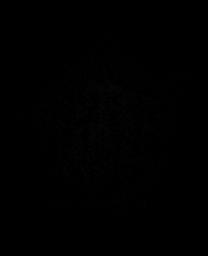

[Series 17: T2 · coronal · 3.0mm · 0.27mm/px · 2 of 32 slices shown (2 of 2)]
[im 1/32]
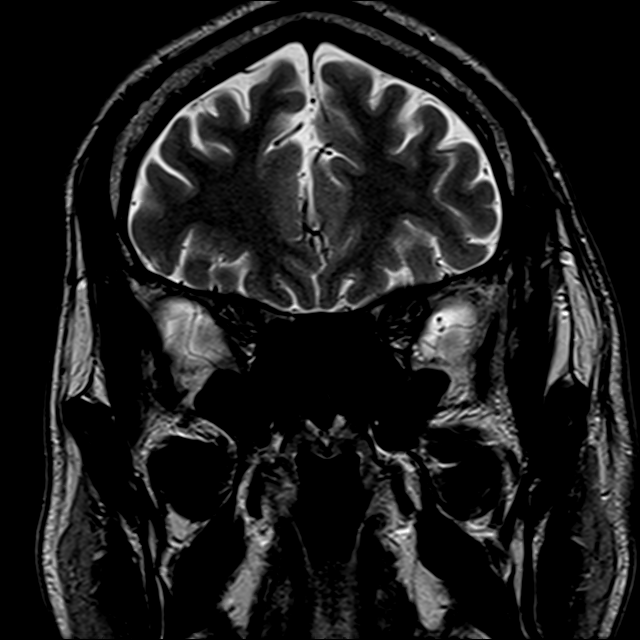
[im 32/32]
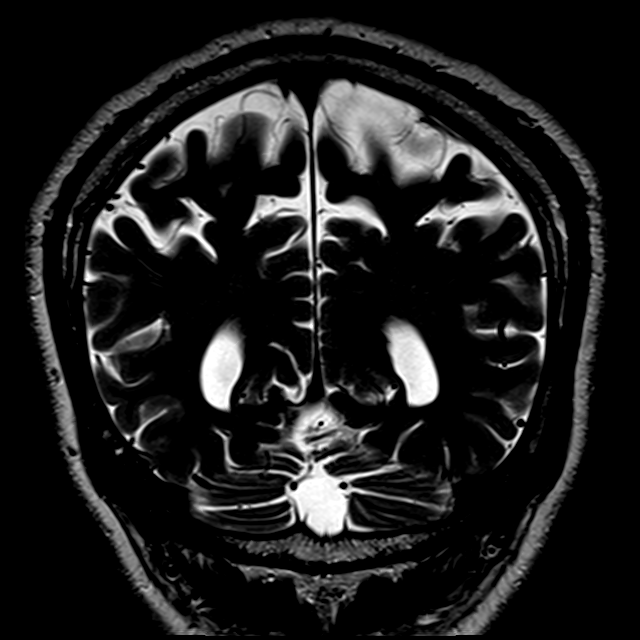

[Series 18: FLAIR · coronal · 3.0mm · 0.56mm/px · 2 of 26 slices shown (2 of 2)]
[im 1/26]
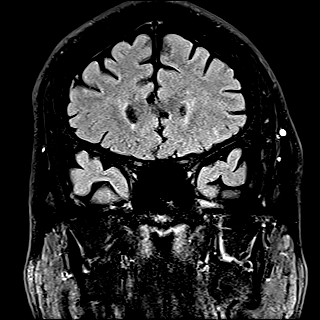
[im 26/26]
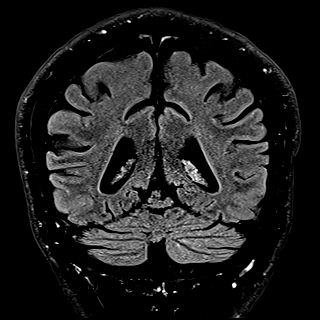

[Series 19: T2 post-contrast · coronal · 5.0mm · 0.72mm/px · 2 of 31 slices shown]
[im 1/31]
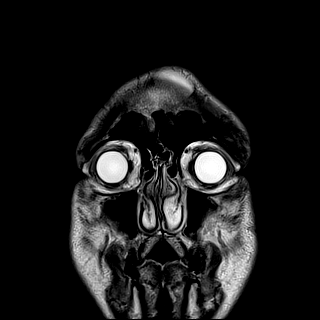
[im 31/31]
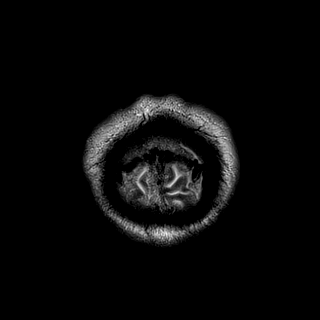

[Series 21: T1 post-contrast · coronal · 5.0mm · 0.34mm/px · 2 of 31 slices shown (1 of 2)]
[im 1/31]
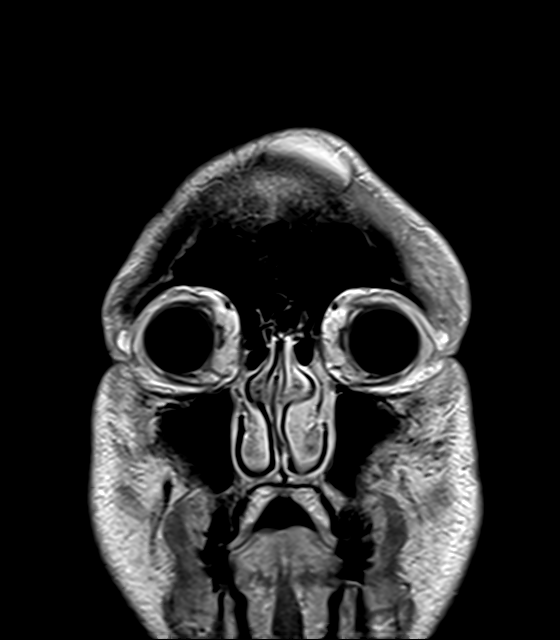
[im 31/31]
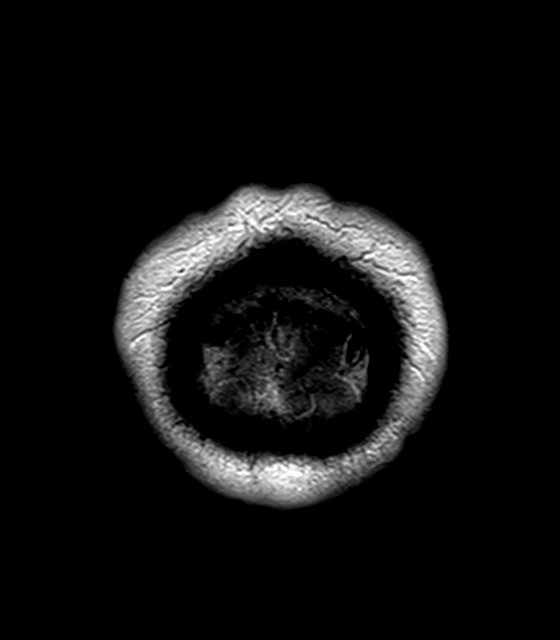

[Series 22: T1 post-contrast · sagittal · 5.0mm · 0.78mm/px · 2 of 25 slices shown (2 of 2)]
[im 1/25]
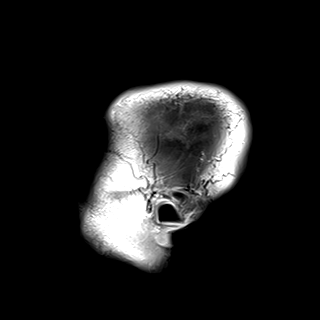
[im 25/25]
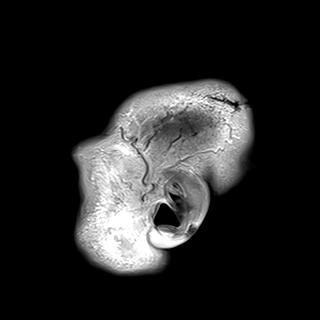

[40 of 48 positions shown; findings below may reference images not displayed]

FINDINGS: Brain: Dedicated thin section imaging through the temporal lobes
demonstrates normal volume and signal of the hippocampi. There is no
evidence of heterotopia or cortical dysplasia.

There is no evidence of an acute infarct, intracranial hemorrhage,
mass, midline shift, or extra-axial fluid collection. The ventricles
and sulci are normal. The brain is normal in signal. No abnormal
enhancement is identified.

Vascular: Major intracranial vascular flow voids are preserved.

Skull and upper cervical spine: Unremarkable bone marrow signal.

Sinuses/Orbits: Decreased size of left periorbital hematoma since
the prior CT. Minimal mucosal thickening in the maxillary sinuses.
Clear mastoid air cells.

Other: 28 x 6 mm frontal scalp lipoma unchanged from the prior MRI.
IMPRESSION: 1. Unremarkable appearance of the brain.
2. Decreased size of left periorbital hematoma.

## 2022-10-14 ENCOUNTER — Ambulatory Visit: Payer: Commercial Managed Care - HMO | Admitting: Podiatry

## 2022-10-25 ENCOUNTER — Telehealth: Payer: Self-pay | Admitting: *Deleted

## 2022-10-25 NOTE — Telephone Encounter (Signed)
Spoke with patient to schedule sx and related appts. Patient stated am surgery was ok, he would call out if needed, he just wants to get the surgery done. .  A little concerned, during the call the patient seemed a bit disconnected or "out of it". Possible I woke him.

## 2022-10-28 ENCOUNTER — Telehealth: Payer: Self-pay | Admitting: Plastic Surgery

## 2022-10-28 ENCOUNTER — Encounter: Payer: Commercial Managed Care - HMO | Admitting: Physician Assistant

## 2022-10-28 NOTE — Telephone Encounter (Signed)
Pt missed preop appointment. We called to try and r/s and went to vmail. Left a message to call the office. Pt needs preop to move forward with sx.

## 2022-10-29 ENCOUNTER — Telehealth: Payer: Self-pay

## 2022-10-29 ENCOUNTER — Encounter (HOSPITAL_BASED_OUTPATIENT_CLINIC_OR_DEPARTMENT_OTHER): Payer: Self-pay | Admitting: Plastic Surgery

## 2022-10-29 ENCOUNTER — Other Ambulatory Visit: Payer: Self-pay

## 2022-10-29 NOTE — Progress Notes (Signed)
   10/29/22 1142  PAT Phone Screen  Is the patient taking a GLP-1 receptor agonist? No  Do You Have Diabetes? No  Do You Have Hypertension? (S)  Yes (on no meds at this time)  Have You Ever Been to the ER for Asthma? No  Have You Taken Oral Steroids in the Past 3 Months? No  Do you Take Phenteramine or any Other Diet Drugs? No  Recent  Lab Work, EKG, CXR? No  Do you have a history of heart problems? No  Any Recent Hospitalizations? No  Height 6' (1.829 m)  Weight 83.9 kg  Pat Appointment Scheduled (S)  Yes (EKG)   Pre op call done with pt. Denies any seizures since 2021. Taking Keppra as prescribed. Will come for EKG and BP check. Above reviewed with Dr Ambrose Pancoast, no clearance or lab work needed prior to surgery.

## 2022-10-29 NOTE — Progress Notes (Shared)
   10/29/22 1204  [place]  Have you ever been diagnosed with sleep apnea through a sleep study? No  Do you snore loudly (loud enough to be heard through closed doors)?  0  Do you often feel tired, fatigued, or sleepy during the daytime (such as falling asleep during driving or talking to someone)? 0  Has anyone observed you stop breathing during your sleep? 0  Do you have, or are you being treated for high blood pressure? 1  BMI more than 35 kg/m2? 0  Age > 43 (1-yes) 0  Male Gender (Yes=1) 1  Obstructive Sleep Apnea Score 2

## 2022-10-29 NOTE — Telephone Encounter (Signed)
Surgical clearance request faxed to Dr. Ellouise Newer [phone]) with confirmed receipt. Will send for scanning into pt's chart.

## 2022-10-29 NOTE — Progress Notes (Signed)
Patient ID: Michael Hobbs, male    DOB: 05-04-1976, 47 y.o.   MRN: [id number]  No chief complaint on file.     ICD-10-CM   1. Lipoma of face  D17.0        History of Present Illness: Michael Hobbs is a 47 y.o.  male  with a history of lipoma and scar to left side of the face.  He presents for preoperative evaluation for upcoming procedure, removal of forehead lipoma, left facial scar revision, scheduled for 11/05/2022 with Dr. Lovena Le.  The patient {HAS HAS [id number] had problems with anesthesia. ***  Summary of Previous Visit: Patient was seen by Dr. Lovena Le on 08/22/2022.  He has history of a scar from a previous fall he sustained after having a seizure several years ago.  Patient stated that he had concerns regarding the appearance of his scar and complaints of persistent swelling over his left eye which affects his vision as well as pain related to the trauma and his scar.  Patient reports he is unhappy about a small lipoma on his forehead that had been there for several years.  On exam, patient was found to have a well-healed irregular scar that goes through the left eyebrow and extends down to the lateral orbital rim.  Patient was noted to have periorbital swelling.  Extraocular movements were intact as well as lid function.  There was also a soft tissue mass just to the left of midline in the frontal region consistent with a lipoma.  It was discussed with the patient that surgery will not fix his pain.  Patient stated that he would like for his scar to be revised to make it a little more aesthetically acceptable and he would also like to try to elevate his left eyelid so that he does not have as much visual disturbance due to the swelling.  A CT scan also showed that the mass to his forehead was consistent with a stable lipoma.  Plan was to excise this at the time of scar revision.  Job: ***  PMH Significant for: ***   Past Medical History: Allergies: No Known Allergies  Current  Medications:  Current Outpatient Medications:    amitriptyline (ELAVIL) 25 MG tablet, Take 1 tablet every night for 1 week, then increase to 2 tablets every night (Patient not taking: Reported on 08/22/2022), Disp: 60 tablet, Rfl: 6   amLODipine (NORVASC) 5 MG tablet, Take 1 tablet (5 mg total) by mouth daily. (Patient not taking: Reported on 08/22/2022), Disp: 30 tablet, Rfl: 1   levETIRAcetam (KEPPRA) 500 MG tablet, TAKE 1 TABLET (500 MG TOTAL) BY MOUTH 2 (TWO) TIMES DAILY., Disp: 60 tablet, Rfl: 11  Past Medical Problems: Past Medical History:  Diagnosis Date   Alcohol abuse    fell in 2006 and had "brain bleed"   HTN (hypertension) 09/21/2020   Nasal fracture 12/01/2012   Periorbital contusion of left eye 12/01/2012   PNA (pneumonia) 10/02/2018   Seizures ([place])    Subarachnoid hematoma ([place]) 12/01/2012   Syncope 12/01/2012   Thrombocytopenia ([place]) 09/21/2020    Past Surgical History: Past Surgical History:  Procedure Laterality Date   HERNIA REPAIR     right shoulder surgery. Left     Social History: Social History   Socioeconomic History   Marital status: Single    Spouse name: Not on file   Number of children: Not on file   Years of education: Not on file   Highest  education level: Not on file  Occupational History   Not on file  Tobacco Use   Smoking status: Every Day    Packs/day: 0.25    Types: Cigars, Cigarettes   Smokeless tobacco: Never   Tobacco comments:    unable to afford at this time/ cigar 3 daily  Vaping Use   Vaping Use: Never used  Substance and Sexual Activity   Alcohol use: Yes    Comment: 1 pint of beer, and a 1/4 pint of liquor daily   Drug use: No   Sexual activity: Not Currently  Other Topics Concern   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  Physical Exam *** Constitutional:      General: Not in acute distress.    Appearance: Normal appearance. Not ill-appearing.  HENT:     Head: Normocephalic and atraumatic.  Eyes:     Pupils: Pupils are equal, round Neck:     Musculoskeletal: Normal range of motion.  Cardiovascular:     Rate and Rhythm: Normal rate    Pulses: Normal pulses.  Pulmonary:     Effort: Pulmonary effort is normal. No respiratory distress.  Abdominal:     General: Abdomen is flat. There is no distension.  Musculoskeletal: Normal range of motion.  Skin:    General: Skin is warm and dry.     Findings: No erythema or rash.  Neurological:     General: No focal deficit present.     Mental Status: Alert and oriented to person, place, and time. Mental status is at baseline.     Motor: No weakness.  Psychiatric:        Mood and Affect: Mood normal.        Behavior: Behavior normal.    Assessment/Plan: The patient is scheduled for *** with Dr. [id number].  Risks, benefits, and alternatives of procedure discussed, questions answered and consent obtained.    Smoking Status: ***; Counseling Given? *** Last Mammogram: ***; Results: ***  Caprini Score: ***; Risk Factors include: ***, BMI *** 25, and length of planned surgery. Recommendation for mechanical *** prophylaxis. Encourage early ambulation.   Pictures obtained: '@consult'$ ***  Post-op Rx sent to pharmacy: {Blank:19197::"Oxycodone, Zofran,  Keflex","Oxycodone, Zofran"}  Patient was provided with the *** General Surgical Risk consent document and Pain Medication Agreement prior to their appointment.  They had adequate time to read through the risk consent documents and Pain Medication Agreement. We also discussed them in person together during this preop appointment. All of their questions were answered to their satisfaction.  Recommended calling if they have any further questions.  Risk consent form and Pain Medication Agreement to be scanned into patient's chart.  ***   Electronically signed by: Lyndee Leo  Janyth Pupa, PA-C 10/29/2022 2:45 PM

## 2022-10-30 ENCOUNTER — Encounter (HOSPITAL_BASED_OUTPATIENT_CLINIC_OR_DEPARTMENT_OTHER)
Admission: RE | Admit: 2022-10-30 | Discharge: 2022-10-30 | Disposition: A | Discharge status: Home / Self Care | Payer: Commercial Managed Care - HMO | Source: Ambulatory Visit | Attending: Plastic Surgery | Admitting: Plastic Surgery

## 2022-10-30 ENCOUNTER — Telehealth: Payer: Self-pay | Admitting: Podiatry

## 2022-10-30 ENCOUNTER — Encounter: Payer: Self-pay | Admitting: Podiatry

## 2022-10-30 ENCOUNTER — Encounter: Payer: Self-pay | Admitting: Student

## 2022-10-30 ENCOUNTER — Ambulatory Visit (INDEPENDENT_AMBULATORY_CARE_PROVIDER_SITE_OTHER): Payer: Commercial Managed Care - HMO | Admitting: Student

## 2022-10-30 ENCOUNTER — Telehealth: Payer: Self-pay | Admitting: Student

## 2022-10-30 VITALS — BP 129/92 | HR 115 | Ht 72.0 in | Wt 170.0 lb

## 2022-10-30 DIAGNOSIS — Z01812 Encounter for preprocedural laboratory examination: Secondary | ICD-10-CM | POA: Insufficient documentation

## 2022-10-30 DIAGNOSIS — D170 Benign lipomatous neoplasm of skin and subcutaneous tissue of head, face and neck: Secondary | ICD-10-CM

## 2022-10-30 NOTE — Telephone Encounter (Signed)
Pt called to say that he knows that the sx needs to be r/s, he said he will quit smoking this Sunday he said. I did remind him that he will have to get tested again before moving forward with sx.

## 2022-10-30 NOTE — Telephone Encounter (Signed)
error 

## 2022-11-05 ENCOUNTER — Ambulatory Visit (HOSPITAL_BASED_OUTPATIENT_CLINIC_OR_DEPARTMENT_OTHER)
Admission: RE | Admit: 2022-11-05 | Payer: Commercial Managed Care - HMO | Source: Home / Self Care | Admitting: Plastic Surgery

## 2022-11-05 DIAGNOSIS — I10 Essential (primary) hypertension: Secondary | ICD-10-CM

## 2022-11-05 SURGERY — EXCISION LIPOMA
Anesthesia: General | Site: Face

## 2022-11-12 ENCOUNTER — Encounter: Payer: Commercial Managed Care - HMO | Admitting: Plastic Surgery

## 2022-11-25 ENCOUNTER — Other Ambulatory Visit: Payer: Self-pay

## 2022-11-25 ENCOUNTER — Telehealth: Payer: Self-pay | Admitting: Neurology

## 2022-11-25 ENCOUNTER — Other Ambulatory Visit: Payer: Self-pay | Admitting: Neurology

## 2022-11-25 DIAGNOSIS — G40009 Localization-related (focal) (partial) idiopathic epilepsy and epileptic syndromes with seizures of localized onset, not intractable, without status epilepticus: Secondary | ICD-10-CM

## 2022-11-25 MED ORDER — LEVETIRACETAM 500 MG PO TABS
500.0000 mg | ORAL_TABLET | Freq: Two times a day (BID) | ORAL | 4 refills | Status: DC
  Filled 2022-11-25: qty 60, 30d supply, fill #0
  Filled 2023-01-30: qty 60, 30d supply, fill #1
  Filled 2023-04-11: qty 60, 30d supply, fill #2
  Filled 2023-06-13: qty 60, 30d supply, fill #3
  Filled 2023-08-08: qty 60, 30d supply, fill #4

## 2022-11-25 MED ORDER — AMITRIPTYLINE HCL 25 MG PO TABS
50.0000 mg | ORAL_TABLET | Freq: Every evening | ORAL | 4 refills | Status: DC
  Filled 2022-11-25: qty 60, 30d supply, fill #0
  Filled 2023-01-30: qty 60, 30d supply, fill #1
  Filled 2023-06-13: qty 60, 30d supply, fill #2
  Filled 2023-10-14: qty 60, 30d supply, fill #0
  Filled 2023-10-14: qty 60, 30d supply, fill #3
  Filled 2023-10-14: qty 60, 30d supply, fill #0

## 2022-11-25 NOTE — Telephone Encounter (Signed)
Pt called no answer left a voice mail to call the office back he needs to call the pharmacy for refills he has refills on file

## 2022-11-25 NOTE — Telephone Encounter (Signed)
Refills sent in for pt.  

## 2022-11-25 NOTE — Telephone Encounter (Signed)
Pt called in and left a message with the access nurse on 11/23/22. Pt is out of Keppra. Has none for today. No Symptoms

## 2022-11-27 ENCOUNTER — Encounter: Payer: Commercial Managed Care - HMO | Admitting: Student

## 2022-12-09 ENCOUNTER — Encounter: Payer: Self-pay | Admitting: Critical Care Medicine

## 2022-12-11 ENCOUNTER — Encounter: Payer: Commercial Managed Care - HMO | Admitting: Student

## 2023-01-30 ENCOUNTER — Other Ambulatory Visit: Payer: Self-pay

## 2023-01-31 ENCOUNTER — Other Ambulatory Visit: Payer: Self-pay

## 2023-04-11 ENCOUNTER — Other Ambulatory Visit: Payer: Self-pay

## 2023-04-11 ENCOUNTER — Encounter (HOSPITAL_COMMUNITY): Payer: Self-pay | Admitting: Emergency Medicine

## 2023-04-11 ENCOUNTER — Other Ambulatory Visit: Payer: Self-pay | Admitting: Critical Care Medicine

## 2023-04-11 ENCOUNTER — Emergency Department (HOSPITAL_COMMUNITY)
Admission: EM | Admit: 2023-04-11 | Discharge: 2023-04-11 | Disposition: A | Discharge status: Home / Self Care | Payer: Commercial Managed Care - HMO | Attending: Emergency Medicine | Admitting: Emergency Medicine

## 2023-04-11 DIAGNOSIS — Z789 Other specified health status: Secondary | ICD-10-CM

## 2023-04-11 DIAGNOSIS — G40009 Localization-related (focal) (partial) idiopathic epilepsy and epileptic syndromes with seizures of localized onset, not intractable, without status epilepticus: Secondary | ICD-10-CM

## 2023-04-11 DIAGNOSIS — F1090 Alcohol use, unspecified, uncomplicated: Secondary | ICD-10-CM | POA: Diagnosis not present

## 2023-04-11 DIAGNOSIS — Y908 Blood alcohol level of 240 mg/100 ml or more: Secondary | ICD-10-CM | POA: Insufficient documentation

## 2023-04-11 LAB — RAPID URINE DRUG SCREEN, HOSP PERFORMED
Amphetamines: NOT DETECTED
Barbiturates: NOT DETECTED
Benzodiazepines: NOT DETECTED
Cocaine: NOT DETECTED
Opiates: NOT DETECTED
Tetrahydrocannabinol: NOT DETECTED

## 2023-04-11 LAB — CBC
HCT: 39.5 % (ref 39.0–52.0)
Hemoglobin: 13.8 g/dL (ref 13.0–17.0)
MCH: 36.5 pg — ABNORMAL HIGH (ref 26.0–34.0)
MCHC: 34.9 g/dL (ref 30.0–36.0)
MCV: 104.5 fL — ABNORMAL HIGH (ref 80.0–100.0)
Platelets: 69 10*3/uL — ABNORMAL LOW (ref 150–400)
RBC: 3.78 MIL/uL — ABNORMAL LOW (ref 4.22–5.81)
RDW: 13.4 % (ref 11.5–15.5)
WBC: 4.1 10*3/uL (ref 4.0–10.5)
nRBC: 0 % (ref 0.0–0.2)

## 2023-04-11 LAB — COMPREHENSIVE METABOLIC PANEL
ALT: 33 U/L (ref 0–44)
AST: 106 U/L — ABNORMAL HIGH (ref 15–41)
Albumin: 4.7 g/dL (ref 3.5–5.0)
Alkaline Phosphatase: 53 U/L (ref 38–126)
Anion gap: 13 (ref 5–15)
BUN: <5 mg/dL — ABNORMAL LOW (ref 6–20)
CO2: 26 mmol/L (ref 22–32)
Calcium: 8.9 mg/dL (ref 8.9–10.3)
Chloride: 99 mmol/L (ref 98–111)
Creatinine, Ser: 0.86 mg/dL (ref 0.61–1.24)
GFR, Estimated: >60 mL/min (ref >60)
Glucose, Bld: 78 mg/dL (ref 70–99)
Potassium: 3.8 mmol/L (ref 3.5–5.1)
Sodium: 138 mmol/L (ref 135–145)
Total Bilirubin: 0.6 mg/dL (ref 0.3–1.2)
Total Protein: 8.2 g/dL — ABNORMAL HIGH (ref 6.5–8.1)

## 2023-04-11 LAB — ETHANOL: Alcohol, Ethyl (B): 342 mg/dL (ref <10)

## 2023-04-11 LAB — ACETAMINOPHEN LEVEL: Acetaminophen (Tylenol), Serum: <10 ug/mL — ABNORMAL LOW (ref 10–30)

## 2023-04-11 LAB — SALICYLATE LEVEL: Salicylate Lvl: <7 mg/dL — ABNORMAL LOW (ref 7.0–30.0)

## 2023-04-11 NOTE — ED Notes (Signed)
Pt stepped outside to smoke. Stated he will be back in seven minutes.

## 2023-04-11 NOTE — Telephone Encounter (Signed)
Requested medications are due for refill today. no  Requested medications are on the active medications list.  yes  Last refill. 11/25/2022 #60 4 rf  Future visit scheduled.   no  Notes to clinic.  Labs are expired. Rx signed by Patrcia Dolly.    Requested Prescriptions  Pending Prescriptions Disp Refills   levETIRAcetam (KEPPRA) 500 MG tablet 60 tablet 4    Sig: Take 1 tablet (500 mg total) by mouth 2 (two) times daily.     Neurology:  Anticonvulsants - levetiracetam Failed - 04/11/2023 12:16 PM      Failed - Cr in normal range and within 360 days    Creatinine, Ser  Date Value Ref Range Status  07/21/2020 0.77 0.76 - 1.27 mg/dL Final         Passed - Completed PHQ-2 or PHQ-9 in the last 360 days      Passed - Valid encounter within last 12 months    Recent Outpatient Visits           8 months ago Essential hypertension   [facility] Jonah Blue B, MD   1 year ago Subarachnoid hematoma with loss of consciousness, sequela [facility])   [facility], Gildardo Pounds, NP   2 years ago Primary hypertension   [place] [facility] Storm Frisk, MD   2 years ago Blunt trauma of left eye, subsequent encounter   [facility] Storm Frisk, MD   2 years ago Subarachnoid hematoma with loss of consciousness, sequela [facility])    [facility] at [facility], [place], [place]

## 2023-04-11 NOTE — ED Notes (Signed)
Pt left without being seen.

## 2023-04-11 NOTE — Discharge Instructions (Signed)
I recommend you cut back on your drinking and follow-up with your primary care provider.  Return for new or worsening symptoms.

## 2023-04-11 NOTE — ED Notes (Signed)
Pt unable to provide UA sample at this time, UA cup provided with pt label.

## 2023-04-11 NOTE — ED Triage Notes (Signed)
Pt reports he has been drinking [employer] since 2009.  Believes there are living organisms that have been in his beer recently.  Pt has called [employer] and a lawyer but would like to see if the organisms are doing any internal damage.  Pt states he has a [employer] can in his bag if we have a forensic specialist here.

## 2023-04-11 NOTE — ED Notes (Signed)
Pt says he's "coming back."

## 2023-04-11 NOTE — ED Provider Notes (Signed)
[place] EMERGENCY DEPARTMENT AT [facility] Provider Note   CSN: [id number] Arrival date & time: 04/11/23  1702     History  Chief Complaint  Patient presents with   Possible Ingestion    Michael Hobbs is a 47 y.o. male.  HPI 47 year old male presenting for concern for ingestion.  Patient states he drinks Coors Light daily and has for years.  He drinks about a pint of alcohol a day plus several beers.  He reports a couple days ago he noted what appeared to be a small insect floating around in the can.  He is concerned that he has ingested a parasite or that there organisms in his beard that are causing issues.  He is asymptomatic.  He has no headache, fevers, vomiting, nausea, abdominal pain, diarrhea.  No blood in his stool or change in stool.  Has not had any unintended weight loss.  He does drink heavily but denies any history of withdrawal.  Last drink today.  He is most worried about getting checked out to make sure there is nothing else going on.  He would like to stop drinking so much.  Denies any drug use.     Home Medications Prior to Admission medications   Medication Sig Start Date End Date Taking? Authorizing Provider  amitriptyline (ELAVIL) 25 MG tablet Take 2 tablets (50 mg total) by mouth Nightly. 11/25/22   Van Clines, MD  amLODipine (NORVASC) 5 MG tablet Take 1 tablet (5 mg total) by mouth daily. 07/22/22   Marcine Matar, MD  levETIRAcetam (KEPPRA) 500 MG tablet Take 1 tablet (500 mg total) by mouth 2 (two) times daily. 11/25/22   Van Clines, MD      Allergies    Patient has no known allergies.    Review of Systems   Review of Systems Review of systems completed and notable as per HPI.  ROS otherwise negative.   Physical Exam Updated Vital Signs BP (!) 147/95   Pulse 87   Temp 98.3 F (36.8 C) (Oral)   Resp 14   SpO2 96%  Physical Exam Vitals and nursing note reviewed.  Constitutional:      General: He is not in acute  distress.    Appearance: He is well-developed.  HENT:     Head: Normocephalic and atraumatic.  Eyes:     Conjunctiva/sclera: Conjunctivae normal.  Cardiovascular:     Rate and Rhythm: Normal rate and regular rhythm.     Heart sounds: No murmur heard. Pulmonary:     Effort: Pulmonary effort is normal. No respiratory distress.     Breath sounds: Normal breath sounds.  Abdominal:     Palpations: Abdomen is soft.     Tenderness: There is no abdominal tenderness. There is no guarding or rebound.  Musculoskeletal:        General: No swelling.     Cervical back: Neck supple.  Skin:    General: Skin is warm and dry.     Capillary Refill: Capillary refill takes less than 2 seconds.  Neurological:     General: No focal deficit present.     Mental Status: He is alert and oriented to person, place, and time. Mental status is at baseline.  Psychiatric:        Mood and Affect: Mood normal.     ED Results / Procedures / Treatments   Labs (all labs ordered are listed, but only abnormal results are displayed) Labs Reviewed  COMPREHENSIVE  METABOLIC PANEL - Abnormal; Notable for the following components:      Result Value   BUN <5 (*)    Total Protein 8.2 (*)    AST 106 (*)    All other components within normal limits  ETHANOL - Abnormal; Notable for the following components:   Alcohol, Ethyl (B) 342 (*)    All other components within normal limits  SALICYLATE LEVEL - Abnormal; Notable for the following components:   Salicylate Lvl <7.0 (*)    All other components within normal limits  ACETAMINOPHEN LEVEL - Abnormal; Notable for the following components:   Acetaminophen (Tylenol), Serum <10 (*)    All other components within normal limits  CBC - Abnormal; Notable for the following components:   RBC 3.78 (*)    MCV 104.5 (*)    MCH 36.5 (*)    Platelets 69 (*)    All other components within normal limits  RAPID URINE DRUG SCREEN, HOSP PERFORMED    EKG None  Radiology No  results found.  Procedures Procedures    Medications Ordered in ED Medications - No data to display  ED Course/ Medical Decision Making/ A&P                                 Medical Decision Making Amount and/or Complexity of Data Reviewed Labs: ordered.   Medical Decision Making:   Michael Hobbs is a 47 y.o. male who presented to the ED today with concern for ingestion.  Vital signs reviewed.  Exam he is well-appearing.  He reports concern for possible contamination of his peer.  He is asymptomatic, no signs of gastrointestinal illness including no vomiting or diarrhea or abdominal pain or tenderness.  I do not think stool samples are warranted at this time.  His lab appears reassuring other than elevated AST likely related to his chronic alcohol use.  His alcohol was elevated about 5 hours ago, however on my examination he is clinically sober and able to make medical decisions.  I do not think he is still intoxicated I suspect his alcohol is chronically elevated given his history of heavy alcohol use.  I recommended that he cut back on his drinking, and follow-up with a primary care provider.  He was comfortable this plan.  He was discharged with return precautions.   Patient placed on continuous vitals and telemetry monitoring while in ED which was reviewed periodically.  Reviewed and confirmed nursing documentation for past medical history, family history, social history.  Patient's presentation is most consistent with acute complicated illness / injury requiring diagnostic workup.           Final Clinical Impression(s) / ED Diagnoses Final diagnoses:  Alcohol use    Rx / DC Orders ED Discharge Orders     None         Laurence Spates, MD 04/11/23 2232

## 2023-04-11 NOTE — Telephone Encounter (Signed)
Medication Refill - Medication: levETIRAcetam (KEPPRA) 500 MG tablet [[id number]]   Has the patient contacted their pharmacy? Yes.    (Agent: If yes, when and what did the pharmacy advise?) Contact PCP   Preferred Pharmacy (with phone number or street name): [facility]   Has the patient been seen for an appointment in the last year OR does the patient have an upcoming appointment? Yes.    Agent: Please be advised that RX refills may take up to 3 business days. We ask that you follow-up with your pharmacy.

## 2023-04-17 ENCOUNTER — Other Ambulatory Visit: Payer: Self-pay

## 2023-04-17 ENCOUNTER — Telehealth: Payer: Self-pay | Admitting: Pharmacist

## 2023-04-17 MED ORDER — AMLODIPINE BESYLATE 5 MG PO TABS
5.0000 mg | ORAL_TABLET | Freq: Every day | ORAL | 0 refills | Status: DC
  Filled 2023-04-17: qty 30, 30d supply, fill #0

## 2023-04-17 NOTE — Telephone Encounter (Signed)
Called patient to schedule a BP appointment. Appt scheduled for 04/23/23 at 4p. Refill sent for amlodipine.  Butch Penny, PharmD, [place]y [place], CPP Clinical Pharmacist [facility] [phone]

## 2023-04-23 ENCOUNTER — Ambulatory Visit: Payer: Commercial Managed Care - HMO | Attending: Family Medicine | Admitting: Pharmacist

## 2023-04-23 ENCOUNTER — Other Ambulatory Visit: Payer: Self-pay

## 2023-04-23 ENCOUNTER — Encounter: Payer: Self-pay | Admitting: Pharmacist

## 2023-04-23 VITALS — BP 155/91 | HR 101

## 2023-04-23 DIAGNOSIS — I10 Essential (primary) hypertension: Secondary | ICD-10-CM | POA: Diagnosis not present

## 2023-04-23 NOTE — Progress Notes (Signed)
   S:     No chief complaint on file.  47 y.o. male who presents for hypertension evaluation, education, and management.  PMH is significant for HTN, seizures, PTSD, tobacco use, hx of alcohol abuse.  Patient was last seen by Dr. Laural Benes on 07/22/2022 and BP at that visit was 138/90 mmHg.    Today, patient arrives in good spirits and presents without assistance. Denies dizziness, headache, blurred vision, swelling. Patient reports hypertension was diagnosed ~3 years ago. Just smoked prior to this appointment.   Family/Social history:  Fhx: stroke Tobacco: 2 Black&Mild daily  Alcohol: one beer (16 oz) daily   Medication adherence denied. Patient has not taken BP medications today.   Current antihypertensives include: amlodipine 5mg  (not taking)  Reported home BP readings: none  Patient reported dietary habits:  -Sodium: does not limit sodium  -Caffeine: denies excessive caffeine intake  Patient-reported exercise habits:  -Active at work but nothing outside of work   O:  Vitals:   04/23/23 1622  BP: (!) 155/91  Pulse: (!) 101    Last 3 Office BP readings: BP Readings from Last 3 Encounters:  04/11/23 (!) 147/95  10/30/22 (!) 129/92  08/22/22 (!) 158/103    BMET    Component Value Date/Time   NA 138 04/11/2023 1746   NA 141 07/21/2020 1035   K 3.8 04/11/2023 1746   CL 99 04/11/2023 1746   CO2 26 04/11/2023 1746   GLUCOSE 78 04/11/2023 1746   BUN <5 (L) 04/11/2023 1746   BUN 7 07/21/2020 1035   CREATININE 0.86 04/11/2023 1746   CALCIUM 8.9 04/11/2023 1746   GFRNONAA >60 04/11/2023 1746   GFRAA 128 07/21/2020 1035    Renal function: CrCl cannot be calculated (Unknown ideal weight.).  Clinical ASCVD: Yes  The ASCVD Risk score (Arnett DK, et al., 2019) failed to calculate for the following reasons:   The patient has a prior MI or stroke diagnosis  Patient is participating in a Managed Medicaid Plan:  Yes    A/P: Hypertension diagnosed currently above  goal d/t medication non-adherence. BP goal < 130/80 mmHg. Medication adherence appears to be appropriate.  -Continued amlodipine 5 mg daily. Encouraged pt to pick-up and start taking.  -Patient educated on purpose, proper use, and potential adverse effects of amlodipine.  -F/u labs ordered - none today -Counseled on lifestyle modifications for blood pressure control including reduced dietary sodium, increased exercise, adequate sleep. -Encouraged patient to check BP at home and bring log of readings to next visit. Counseled on proper use of home BP cuff.   Results reviewed and written information provided.    Written patient instructions provided. Patient verbalized understanding of treatment plan.  Total time in face to face counseling 30 minutes.    Follow-up:  Pharmacist in 2 weeks  Butch Penny, PharmD, [place], CPP Clinical Pharmacist [facility] [phone]

## 2023-05-07 ENCOUNTER — Encounter: Payer: Self-pay | Admitting: Pharmacist

## 2023-05-07 ENCOUNTER — Other Ambulatory Visit: Payer: Self-pay

## 2023-05-07 ENCOUNTER — Ambulatory Visit: Payer: Commercial Managed Care - HMO | Attending: Critical Care Medicine | Admitting: Pharmacist

## 2023-05-07 VITALS — BP 119/79 | HR 92

## 2023-05-07 DIAGNOSIS — I10 Essential (primary) hypertension: Secondary | ICD-10-CM | POA: Diagnosis not present

## 2023-05-07 MED ORDER — AMLODIPINE BESYLATE 5 MG PO TABS
5.0000 mg | ORAL_TABLET | Freq: Every day | ORAL | 1 refills | Status: DC
  Filled 2023-05-07: qty 90, 90d supply, fill #0
  Filled 2023-06-13: qty 30, 30d supply, fill #0

## 2023-05-07 NOTE — Progress Notes (Signed)
   S:    No chief complaint on file.  47 y.o. male who presents for hypertension evaluation, education, and management.  PMH is significant for HTN, seizures, PTSD, tobacco use, hx of alcohol abuse. Patient's chart reports hypertension was diagnosed in 2022.  Michael Hobbs  Patient was last seen by Dr. Laural Benes on 07/22/2022 and BP at that visit was 138/90 mmHg. Last seen by pharmacist on 04/23/2023 and BP was 155/91.   At last visit, amlodipine 5 mg was continued since patient was not currently taking.   Today, patient arrives in good spirits and presents without assistance. Reports smoking 2 hours prior to visit. Denies dizziness, headache, blurred vision, swelling.   Family/Social history:  Fhx: stroke Tobacco: 2 Black&Mild daily  Alcohol: one beer (16 oz) daily   Medication adherence good. Patient picked up amlodipine on 04/23/2023 and reports taking every day. Patient has taken BP medications today.   Current antihypertensives include: amlodipine 5 mg   Antihypertensives tried in the past include: none  Reported home BP readings: none  Patient reported dietary habits:  -Sodium: does not limit sodium  -Caffeine: denies excessive caffeine intake  Patient-reported exercise habits: Active at work but nothing outside of work   ASCVD risk factors include: tobacco use, HTN  O:  Vitals:   05/07/23 1618  BP: 119/79  Pulse: 92    Last 3 Office BP readings: BP Readings from Last 3 Encounters:  05/07/23 119/79  04/23/23 (!) 155/91  04/11/23 (!) 147/95    BMET    Component Value Date/Time   NA 138 04/11/2023 1746   NA 141 07/21/2020 1035   K 3.8 04/11/2023 1746   CL 99 04/11/2023 1746   CO2 26 04/11/2023 1746   GLUCOSE 78 04/11/2023 1746   BUN <5 (L) 04/11/2023 1746   BUN 7 07/21/2020 1035   CREATININE 0.86 04/11/2023 1746   CALCIUM 8.9 04/11/2023 1746   GFRNONAA >60 04/11/2023 1746   GFRAA 128 07/21/2020 1035    Renal function: CrCl cannot be calculated (Patient's most recent  lab result is older than the maximum 21 days allowed.).  Clinical ASCVD: No  The ASCVD Risk score (Arnett DK, et al., 2019) failed to calculate for the following reasons:   The patient has a prior MI or stroke diagnosis  Patient is participating in a Managed Medicaid Plan: Yes   A/P: Hypertension diagnosed, currently controlled on current medications. BP goal < 130/80 mmHg. Medication adherence appears good.  -Continue amlodipine 5 mg daily  -Counseled on lifestyle modifications for blood pressure control including reduced dietary sodium, increased exercise, adequate sleep.  Results reviewed and written information provided.    Written patient instructions provided. Patient verbalized understanding of treatment plan.  Total time in face to face counseling 20 minutes.    Follow-up:  Pharmacist: f/u no longer needed since patient within goal PCP: 07/31/2023 with Dr. Jaynie Bream, PharmD PGY1 Pharmacy Resident 05/07/2023 4:19 PM

## 2023-06-13 ENCOUNTER — Telehealth: Payer: Self-pay | Admitting: Critical Care Medicine

## 2023-06-13 ENCOUNTER — Other Ambulatory Visit: Payer: Self-pay

## 2023-06-13 NOTE — Telephone Encounter (Signed)
Medication Refill - Medication: levETIRAcetam (KEPPRA) 500 MG tablet  amitriptyline (ELAVIL) 25 MG tablet   Has the patient contacted their pharmacy? Yes.   (Agent: If no, request that the patient contact the pharmacy for the refill. If patient does not wish to contact the pharmacy document the reason why and proceed with request.) (Agent: If yes, when and what did the pharmacy advise?)  Preferred Pharmacy (with phone number or street name):  [facility]  [address] [place] [address]  Phone: [phone] Fax: [phone]   Has the patient been seen for an appointment in the last year OR does the patient have an upcoming appointment? Yes.    Agent: Please be advised that RX refills may take up to 3 business days. We ask that you follow-up with your pharmacy.

## 2023-06-13 NOTE — Telephone Encounter (Signed)
Called [facility] pharmacy, spoke with Colin Mulders in the pharmacy. She states that patient was refills available and will get them ready for patient to pick up. Called and spoke with patient, advised him that his medication is ready at pharmacy.

## 2023-07-04 ENCOUNTER — Encounter: Payer: Self-pay | Admitting: Neurology

## 2023-07-04 ENCOUNTER — Ambulatory Visit: Payer: Managed Care, Other (non HMO) | Admitting: Neurology

## 2023-07-30 ENCOUNTER — Other Ambulatory Visit: Payer: Self-pay | Admitting: Critical Care Medicine

## 2023-07-30 NOTE — Telephone Encounter (Signed)
Medication Refill -  Most Recent Primary Care Visit:  Provider: Drucilla Chalet  Department: [facility]  Visit Type: OFFICE VISIT  Date: 05/07/2023  Medication: amLODipine (NORVASC) 5 MG tablet   Has the patient contacted their pharmacy? No  Is this the correct pharmacy for this prescription? Yes If no, delete pharmacy and type the correct one.  This is the patient's preferred pharmacy: [facility] [address] [place] [address] Phone: [phone] Fax: [phone]  Has the prescription been filled recently? No  Is the patient out of the medication? Yes  Has the patient been seen for an appointment in the last year OR does the patient have an upcoming appointment? Yes  Can we respond through MyChart? Yes  Agent: Please be advised that Rx refills may take up to 3 business days. We ask that you follow-up with your pharmacy.

## 2023-07-31 ENCOUNTER — Ambulatory Visit: Payer: Self-pay | Admitting: Internal Medicine

## 2023-07-31 ENCOUNTER — Other Ambulatory Visit: Payer: Self-pay

## 2023-07-31 MED ORDER — AMLODIPINE BESYLATE 5 MG PO TABS
5.0000 mg | ORAL_TABLET | Freq: Every day | ORAL | 0 refills | Status: DC
  Filled 2023-07-31: qty 30, 30d supply, fill #0

## 2023-07-31 NOTE — Telephone Encounter (Signed)
Requested Prescriptions  Pending Prescriptions Disp Refills   amLODipine (NORVASC) 5 MG tablet 90 tablet 1    Sig: Take 1 tablet (5 mg total) by mouth daily.     Cardiovascular: Calcium Channel Blockers 2 Passed - 07/31/2023  8:39 AM      Passed - Last BP in normal range    BP Readings from Last 1 Encounters:  05/07/23 119/79         Passed - Last Heart Rate in normal range    Pulse Readings from Last 1 Encounters:  05/07/23 92         Passed - Valid encounter within last 6 months    Recent Outpatient Visits           2 months ago Essential hypertension   [facility] - A Dept Of [place]. [facility] Lois Huxley, Cornelius Moras, RPH-CPP   3 months ago Essential hypertension   [facility] - A Dept Of [place]. [facility] Drucilla Chalet, RPH-CPP   1 year ago Essential hypertension   [facility] - A Dept Of [place]. [facility] Jonah Blue B, MD   2 years ago Subarachnoid hematoma with loss of consciousness, sequela [facility])   [facility], Gildardo Pounds, NP   2 years ago Primary hypertension   [facility] - A Dept Of [place]. [facility] Storm Frisk, MD       Future Appointments             Today Marcine Matar, MD [facility] - A Dept Of Eligha Bridegroom. [facility]

## 2023-08-08 ENCOUNTER — Other Ambulatory Visit: Payer: Self-pay

## 2023-08-11 ENCOUNTER — Other Ambulatory Visit: Payer: Self-pay

## 2023-10-14 ENCOUNTER — Other Ambulatory Visit (HOSPITAL_COMMUNITY): Payer: Self-pay

## 2023-10-14 ENCOUNTER — Other Ambulatory Visit: Payer: Self-pay | Admitting: Critical Care Medicine

## 2023-10-14 ENCOUNTER — Other Ambulatory Visit: Payer: Self-pay | Admitting: Neurology

## 2023-10-14 ENCOUNTER — Other Ambulatory Visit: Payer: Self-pay

## 2023-10-14 DIAGNOSIS — G40009 Localization-related (focal) (partial) idiopathic epilepsy and epileptic syndromes with seizures of localized onset, not intractable, without status epilepticus: Secondary | ICD-10-CM

## 2023-10-14 MED ORDER — LEVETIRACETAM 500 MG PO TABS
500.0000 mg | ORAL_TABLET | Freq: Two times a day (BID) | ORAL | 4 refills | Status: DC
Start: 2023-10-14 — End: 2023-12-19
  Filled 2023-10-14: qty 60, 30d supply, fill #0

## 2023-10-14 NOTE — Telephone Encounter (Signed)
 Last Fill: Keppra: 11/25/22     Elavil: 11/25/22  Last OV: 05/07/23 Next OV: None Scheduled  Routing to provider for review/authorization.

## 2023-10-14 NOTE — Telephone Encounter (Signed)
 Copied from CRM [id number]. Topic: Clinical - Medication Refill >> Oct 14, 2023  9:24 AM Gildardo Pounds wrote: Most Recent Primary Care Visit:  Provider: Drucilla Chalet  Department: [facility]  Visit Type: OFFICE VISIT  Date: 05/07/2023  Medication: levETIRAcetam (KEPPRA) 500 MG tablet amitriptyline (ELAVIL) 25 MG tablet  Has the patient contacted their pharmacy? No (Agent: If no, request that the patient contact the pharmacy for the refill. If patient does not wish to contact the pharmacy document the reason why and proceed with request.) (Agent: If yes, when and what did the pharmacy advise?)  Is this the correct pharmacy for this prescription? Yes If no, delete pharmacy and type the correct one.  This is the patient's preferred pharmacy:  [facility] - [facility] [address] [place] [address] Phone: [phone] Fax: [phone]  Has the prescription been filled recently? No  Is the patient out of the medication? Yes  Has the patient been seen for an appointment in the last year OR does the patient have an upcoming appointment? Yes  Can we respond through MyChart? No  Agent: Please be advised that Rx refills may take up to 3 business days. We ask that you follow-up with your pharmacy.

## 2023-10-15 ENCOUNTER — Other Ambulatory Visit: Payer: Self-pay

## 2023-12-19 ENCOUNTER — Other Ambulatory Visit: Payer: Self-pay

## 2023-12-19 ENCOUNTER — Telehealth: Payer: Self-pay | Admitting: Neurology

## 2023-12-19 DIAGNOSIS — G40009 Localization-related (focal) (partial) idiopathic epilepsy and epileptic syndromes with seizures of localized onset, not intractable, without status epilepticus: Secondary | ICD-10-CM

## 2023-12-19 MED ORDER — LEVETIRACETAM 500 MG PO TABS
500.0000 mg | ORAL_TABLET | Freq: Two times a day (BID) | ORAL | 2 refills | Status: DC
  Filled 2023-12-19: qty 60, 30d supply, fill #0
  Filled 2024-02-18 (×2): qty 60, 30d supply, fill #1

## 2023-12-19 NOTE — Telephone Encounter (Signed)
 Refill sent in for pt.

## 2023-12-19 NOTE — Telephone Encounter (Signed)
 1. Which medications need refilled? (List name and dosage, if known) keppra   2. Which pharmacy/location is medication to be sent to? (include street and city if Insurance claims handler) Probation officer at [employer]

## 2024-01-03 ENCOUNTER — Encounter (HOSPITAL_COMMUNITY): Payer: Self-pay | Admitting: Emergency Medicine

## 2024-01-03 ENCOUNTER — Other Ambulatory Visit: Payer: Self-pay

## 2024-01-03 ENCOUNTER — Emergency Department (HOSPITAL_COMMUNITY): Payer: Worker's Compensation

## 2024-01-03 ENCOUNTER — Emergency Department (HOSPITAL_COMMUNITY)
Admission: EM | Admit: 2024-01-03 | Discharge: 2024-01-03 | Disposition: A | Discharge status: Home / Self Care | Payer: Worker's Compensation | Attending: Emergency Medicine | Admitting: Emergency Medicine

## 2024-01-03 DIAGNOSIS — Y990 Civilian activity done for income or pay: Secondary | ICD-10-CM | POA: Insufficient documentation

## 2024-01-03 DIAGNOSIS — I10 Essential (primary) hypertension: Secondary | ICD-10-CM | POA: Diagnosis not present

## 2024-01-03 DIAGNOSIS — S63502A Unspecified sprain of left wrist, initial encounter: Secondary | ICD-10-CM | POA: Diagnosis not present

## 2024-01-03 DIAGNOSIS — X500XXA Overexertion from strenuous movement or load, initial encounter: Secondary | ICD-10-CM | POA: Insufficient documentation

## 2024-01-03 DIAGNOSIS — F17210 Nicotine dependence, cigarettes, uncomplicated: Secondary | ICD-10-CM | POA: Insufficient documentation

## 2024-01-03 DIAGNOSIS — S6992XA Unspecified injury of left wrist, hand and finger(s), initial encounter: Secondary | ICD-10-CM | POA: Diagnosis present

## 2024-01-03 MED ORDER — ACETAMINOPHEN 500 MG PO TABS: 1000.0000 mg | ORAL_TABLET | Freq: Once | ORAL | Status: DC

## 2024-01-03 NOTE — ED Provider Notes (Signed)
 [place] EMERGENCY DEPARTMENT AT [facility] Provider Note  History  Chief Complaint:  Wrist Pain  The history is provided by the patient.     Michael Hobbs is a 48 y.o. male with a history of ethanol use, hypertension who presents the emergency department for left wrist injury.  He reports that he was working on his job doing strenuous activity when he noticed his left wrist started hurting.  He reports that nothing fell on it.  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  He states that he is no longer working at this job.  He states he has not had a fever, weakness, numbness.  States that he just came to evaluate the left wrist.  Past Medical History:  Diagnosis Date   Alcohol abuse    fell in 2006 and had "brain bleed"   HTN (hypertension) 09/21/2020   Nasal fracture 12/01/2012   Periorbital contusion of left eye 12/01/2012   PNA (pneumonia) 10/02/2018   Seizures (HCC)    Subarachnoid hematoma (HCC) 12/01/2012   Syncope 12/01/2012   Thrombocytopenia (HCC) 09/21/2020    Past Surgical History:  Procedure Laterality Date   HERNIA REPAIR     right shoulder surgery. Left     Family History  Problem Relation Age of Onset   Stroke Father     Social History   Tobacco Use   Smoking status: Every Day    Current packs/day: 0.25    Types: Cigars, Cigarettes   Smokeless tobacco: Never   Tobacco comments:    unable to afford at this time/ cigar 3 daily  Vaping Use   Vaping status: Never Used  Substance Use Topics   Alcohol use: Yes    Comment: 1 pint of beer, and a 1/4 pint of liquor daily   Drug use: No    Review of Systems  Review of Systems   Reviewed and documented in HPI if pertinent.   Physical Exam   ED Triage Vitals  Encounter Vitals Group     BP 01/03/24 1349 (!) 151/107     Systolic BP Percentile --      Diastolic BP Percentile --      Pulse Rate 01/03/24 1349 100     Resp 01/03/24 1349 16     Temp 01/03/24 1349 98 F  (36.7 C)     Temp src --      SpO2 01/03/24 1349 92 %     Weight 01/03/24 1352 185 lb (83.9 kg)     Height 01/03/24 1352 6' (1.829 m)     Head Circumference --      Peak Flow --      Pain Score 01/03/24 1352 7     Pain Loc --      Pain Education --      Exclude from Growth Chart --      Physical Exam Vitals and nursing note reviewed.  Constitutional:      General: He is not in acute distress.    Appearance: He is well-developed.  HENT:     Head: Normocephalic and atraumatic.  Eyes:     Conjunctiva/sclera: Conjunctivae normal.  Cardiovascular:     Rate and Rhythm: Normal rate and regular rhythm.     Heart sounds: No murmur heard. Pulmonary:     Effort: Pulmonary effort is normal. No respiratory distress.     Breath sounds: Normal breath sounds.  Abdominal:     Palpations: Abdomen is  soft.     Tenderness: There is no abdominal tenderness.  Musculoskeletal:        General: No swelling.     Cervical back: Neck supple.     Comments: 5-5 strength in grip strength, wrist extension, wrist flexion, finger abduction, finger abduction; intact sensation to medial, lateral hand; 2+ radial and ulnar pulses; no appreciable swelling to the left wrist; no tenderness to the forearm or elbow; no erythema of the upper extremity  Skin:    General: Skin is warm and dry.     Capillary Refill: Capillary refill takes less than 2 seconds.  Neurological:     Mental Status: He is alert.  Psychiatric:        Mood and Affect: Mood normal.      Procedures   Procedures  ED Course - Medical Decision Making  Brief Overview Michael Hobbs is a 48 y.o. male who presents as per above.  I have reviewed the nursing documentation for past medical history, family history, and social history and agree.  I have reviewed the patient's vital signs. There are no abnormalities.  Initial Differential Diagnoses: I am primarily concerned for fracture, dislocation, MSK sprain  Therapies: These  medications and interventions were provided for the patient while in the ED.  Medications  acetaminophen  (TYLENOL ) tablet 1,000 mg (has no administration in time range)    Testing Results: On my interpretation imaging is significant for: L wrist XR without fracture or dislocation  See the EMR for full details regarding lab and imaging results.   Medical Decision Making 48 year old male who presents the emergency department for left wrist injury.  On exam patient does have tenderness to the left wrist on palpation.  There is no appreciable swelling or erythema.  Patient is able to passively range the wrist without any difficulty.  He does have a normal neurovascular exam.  X-rays were performed which reveals no acute fracture or dislocation.  It is most likely there MSK sprain.  Patient was instructed to use Tylenol  for pain control.  We also placed the wrist splint here in the emergency department.  I do feel the patient is stable for outpatient management.  Therefore patient was discharged with PCP follow-up.  Problems Addressed: Sprain of left wrist, initial encounter: complicated acute illness or injury  Amount and/or Complexity of Data Reviewed Radiology: ordered.  Risk OTC drugs.     ### All radiography studies, electrocardiograms, and laboratory data were personally reviewed by me and incorporated into my medical decision making. Impression   1. Sprain of left wrist, initial encounter      Note: Dragon medical dictation software was used in the creation of this note.     Arminda Landmark, MD 01/03/24 1533    Dalene Duck, MD 01/04/24 2103

## 2024-01-03 NOTE — ED Notes (Signed)
 Pt transported to imaging.

## 2024-01-03 NOTE — Progress Notes (Signed)
 Orthopedic Tech Progress Note Patient Details:  Michael Hobbs 1975/12/07 [id number]  Ortho Devices Type of Ortho Device: Velcro wrist splint Ortho Device/Splint Location: Left wrist Ortho Device/Splint Interventions: Application   Post Interventions Patient Tolerated: Well  Michael Hobbs 01/03/2024, 3:35 PM

## 2024-01-03 NOTE — Discharge Instructions (Addendum)
 Michael Hobbs:  Thank you for allowing us  to take care of you today.  We hope you begin feeling better soon. You were seen today for L wrist pain. XR were negative for fracture or dislocation. Please use tylenol  for pain. You were given a brace for comfort.  To-Do:  Please follow-up with your primary doctor within the next 2-3 days. It is important that you review any labs or imaging results (if any) that you had today with them. Your preliminary imaging results (if any) are attached. Please return to the Emergency Department or call 911 if you experience chest pain, shortness of breath, severe pain, severe fever, altered mental status, or have any reason to think that you need emergency medical care.  Thank you again.  Hope you feel better soon.  Arminda Landmark, MD Department of Emergency Medicine

## 2024-01-03 NOTE — ED Triage Notes (Signed)
 Pt reports work injury to left wrist on Monday. States it suddenly happened.

## 2024-01-08 ENCOUNTER — Telehealth: Payer: Self-pay | Admitting: Neurology

## 2024-01-08 ENCOUNTER — Ambulatory Visit: Payer: Self-pay | Admitting: *Deleted

## 2024-01-08 NOTE — Telephone Encounter (Signed)
 Called twice to schedule appt at 11:10.  Left message on voicemail to return call.

## 2024-01-08 NOTE — Telephone Encounter (Signed)
 Pt hurt his arm at work and he wants an MRI of his arm because the ER wouldn't do it, pt advised that he needed to call his PCP for his arm pain to see if they can help him with imaging for his arm,

## 2024-01-08 NOTE — Telephone Encounter (Signed)
  Chief Complaint: Patient complains of continued pain in thumb and wrist Symptoms: 5/17- seen at ED and diagnosed with [place] of wrist- patient reports the pain is not better and he still has swelling un thumb and wrist Frequency: 5/17-ED  Disposition: [] ED /[] Urgent Care (no appt availability in office) / [] Appointment(In office/virtual)/ []  [facility]/ [] Home Care/ [] Refused Recommended Disposition /[] [facility]/ [x]  Follow-up with PCP Additional Notes: Unable to find open appointment within disposition- patient reports pain is 10/10 - message sent to office for scheduling  Copied from CRM [id number]. Topic: Clinical - Red Word Triage >> Jan 08, 2024  3:24 PM Turkey B wrote: [facility] that prompted transfer to Nurse Triage: pt still has severe pain in left arm Reason for Disposition  [1] After 3 days AND [2] pain not improving  Answer Assessment - Initial Assessment Questions 1. MECHANISM: "How did the injury happen?"      Sprain of wrist 2. WHEN: "When did the injury happen?" (Minutes or hours ago)      Tuesday last week- 8 days 3. LOCATION: "Which wrist or hand is injured?"     Left wrist- thumb to outer wrist 4. APPEARANCE of INJURY: "What does the injury look like?"      Thumb swelling- outer wrist swelling 5. SEVERITY: "Can your child move the wrist or hand normally?" For wrist, can rotate palm up and down and move the hand up and down (wrist flexion/extension). For hand, can make a fist and open it straight.     Patient is able to open and close hand- with pain 6. SIZE: For bruises or swelling, ask: "How large is it?" (Inches or centimeters)      Swelling-in thumb and wrist 7. PAIN: "Is there pain?" If so, ask: "How bad is the pain?"      10/10  Protocols used: Wrist or Hand Injury-P-AH, Hand and Wrist Injury-A-AH

## 2024-01-08 NOTE — Telephone Encounter (Signed)
 Pt. Is asking for Order of MRI from Dr. Ty Gales, please call back

## 2024-01-08 NOTE — Telephone Encounter (Signed)
 Patient called back due to the previous call disconnecting prior to him being able to give the best phone number to be reached at. Patient would like a call back at [phone] in response to his request to be seen for his wrist.

## 2024-02-18 ENCOUNTER — Other Ambulatory Visit: Payer: Self-pay

## 2024-02-18 ENCOUNTER — Telehealth: Payer: Self-pay | Admitting: Neurology

## 2024-02-18 DIAGNOSIS — G40009 Localization-related (focal) (partial) idiopathic epilepsy and epileptic syndromes with seizures of localized onset, not intractable, without status epilepticus: Secondary | ICD-10-CM

## 2024-02-18 MED ORDER — LEVETIRACETAM 500 MG PO TABS
500.0000 mg | ORAL_TABLET | Freq: Two times a day (BID) | ORAL | 2 refills | Status: AC
  Filled 2024-04-16: qty 60, 30d supply, fill #0
  Filled 2024-06-22: qty 60, 30d supply, fill #1
  Filled 2024-08-18: qty 60, 30d supply, fill #2

## 2024-02-18 NOTE — Telephone Encounter (Signed)
 Refill sent in for pt.

## 2024-02-18 NOTE — Telephone Encounter (Signed)
 Pt. Needs refill ASAP Rx Keppra  [facility]

## 2024-02-19 ENCOUNTER — Telehealth: Payer: Self-pay

## 2024-02-19 NOTE — Telephone Encounter (Signed)
 Pt was seen last March for a lipoma consult. Surgery was canceled due to still smoking.  Pt called and stated he is no longer smoking and would like to reschedule his surgery.  Will send message to front desk to schedule a f/up with Dr. Waddell since it has been over a year since he has been seen.

## 2024-02-23 ENCOUNTER — Ambulatory Visit (INDEPENDENT_AMBULATORY_CARE_PROVIDER_SITE_OTHER): Admitting: Plastic Surgery

## 2024-02-23 ENCOUNTER — Encounter: Payer: Self-pay | Admitting: Plastic Surgery

## 2024-02-23 VITALS — BP 161/109 | HR 83 | Ht 72.0 in | Wt 165.4 lb

## 2024-02-23 DIAGNOSIS — S0512XS Contusion of eyeball and orbital tissues, left eye, sequela: Secondary | ICD-10-CM

## 2024-02-23 DIAGNOSIS — D489 Neoplasm of uncertain behavior, unspecified: Secondary | ICD-10-CM

## 2024-02-23 DIAGNOSIS — L905 Scar conditions and fibrosis of skin: Secondary | ICD-10-CM

## 2024-02-23 DIAGNOSIS — D170 Benign lipomatous neoplasm of skin and subcutaneous tissue of head, face and neck: Secondary | ICD-10-CM

## 2024-02-23 NOTE — Progress Notes (Signed)
 History, returns today for scheduling.  I had seen him last year regarding a scar revision and a lipoma removal.  Patient surgery was canceled due to his ongoing smoking.  Has successfully quit smoking though he restarted for short period of time he is motivated to stop smoking again and would like to be rescheduled for surgery.  He understands again that revision of the scar on his left eyebrow and eyelid is simply to improve the appearance of the scar.  There is no evidence to suggest that scar revision improves any type of pain.  I have strongly recommended to him that he speak with his primary care provider regarding ongoing pain management for his scar.  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  Photographs were obtained today with his consent.  I spent 30 minutes reviewing the patient's chart, examining the patient, discussing the procedure, and documenting.

## 2024-04-16 ENCOUNTER — Other Ambulatory Visit: Payer: Self-pay

## 2024-04-20 ENCOUNTER — Other Ambulatory Visit: Payer: Self-pay

## 2024-05-10 ENCOUNTER — Encounter: Payer: Self-pay | Admitting: Neurology

## 2024-05-10 ENCOUNTER — Ambulatory Visit: Payer: Commercial Managed Care - HMO | Admitting: Neurology

## 2024-06-21 ENCOUNTER — Other Ambulatory Visit: Payer: Self-pay | Admitting: Critical Care Medicine

## 2024-06-21 DIAGNOSIS — G40009 Localization-related (focal) (partial) idiopathic epilepsy and epileptic syndromes with seizures of localized onset, not intractable, without status epilepticus: Secondary | ICD-10-CM

## 2024-06-21 NOTE — Telephone Encounter (Unsigned)
 Copied from CRM [id number]. Topic: Clinical - Medication Refill >> Jun 21, 2024 12:40 PM Zebedee SAUNDERS wrote: Medication: levETIRAcetam  (KEPPRA ) 500 MG tablet  Has the patient contacted their pharmacy? Yes (Agent: If no, request that the patient contact the pharmacy for the refill. If patient does not wish to contact the pharmacy document the reason why and proceed with request.) (Agent: If yes, when and what did the pharmacy advise?)  This is the patient's preferred pharmacy:  [name] [address] [place] [address] Phone: [phone] Fax: [phone]  [id number] [place], [place] - [address] AT [address] [place] [address] Phone: [phone] Fax: [phone]  Is this the correct pharmacy for this prescription? Yes If no, delete pharmacy and type the correct one.   Has the prescription been filled recently? Yes  Is the patient out of the medication? Yes  Has the patient been seen for an appointment in the last year OR does the patient have an upcoming appointment? Yes  Can we respond through MyChart? Yes  Agent: Please be advised that Rx refills may take up to 3 business days. We ask that you follow-up with your pharmacy.

## 2024-06-22 ENCOUNTER — Other Ambulatory Visit: Payer: Self-pay

## 2024-06-22 NOTE — Telephone Encounter (Signed)
 Requested medications are due for refill today.  yes  Requested medications are on the active medications list.  yes  Last refill. 02/18/2024 #60 2 rf  Future visit scheduled.   With pharmacy  Notes to clinic.  Labs are expired.    Requested Prescriptions  Pending Prescriptions Disp Refills   levETIRAcetam  (KEPPRA ) 500 MG tablet 60 tablet 2    Sig: Take 1 tablet (500 mg total) by mouth 2 (two) times daily.     Neurology:  Anticonvulsants - levetiracetam  Failed - 06/22/2024  4:39 PM      Failed - Cr in normal range and within 360 days    Creatinine, Ser  Date Value Ref Range Status  04/11/2023 0.86 0.61 - 1.24 mg/dL Final         Failed - Completed PHQ-2 or PHQ-9 in the last 360 days      Failed - Valid encounter within last 12 months    Recent Outpatient Visits           1 year ago Essential hypertension   [facility] [place]. [facility] Fleeta Tonia Garnette LITTIE, RPH-CPP   1 year ago Essential hypertension   [facility] [place]. [facility] Fleeta Tonia Garnette LITTIE, RPH-CPP   1 year ago Essential hypertension   [facility] [place]. [facility] Vicci Sober B, MD   3 years ago Subarachnoid hematoma with loss of consciousness, sequela   [facility], [place] RAMAN, NP   3 years ago Primary hypertension   [facility] Of [place]. [facility] Brien Belvie BRAVO, MD

## 2024-07-05 ENCOUNTER — Telehealth: Payer: Self-pay | Admitting: Critical Care Medicine

## 2024-07-05 NOTE — Telephone Encounter (Signed)
 Confirmed appt for 11/18

## 2024-07-06 ENCOUNTER — Telehealth: Payer: Self-pay | Admitting: Neurology

## 2024-07-06 ENCOUNTER — Other Ambulatory Visit: Payer: Self-pay

## 2024-07-06 ENCOUNTER — Encounter: Payer: Self-pay | Admitting: Nurse Practitioner

## 2024-07-06 ENCOUNTER — Ambulatory Visit: Payer: Self-pay | Attending: Nurse Practitioner | Admitting: Nurse Practitioner

## 2024-07-06 ENCOUNTER — Ambulatory Visit: Payer: Self-pay | Admitting: Pharmacist

## 2024-07-06 VITALS — BP 145/90 | HR 73 | Resp 19 | Ht 72.0 in | Wt 171.2 lb

## 2024-07-06 DIAGNOSIS — I10 Essential (primary) hypertension: Secondary | ICD-10-CM

## 2024-07-06 DIAGNOSIS — Z1211 Encounter for screening for malignant neoplasm of colon: Secondary | ICD-10-CM

## 2024-07-06 DIAGNOSIS — Z5181 Encounter for therapeutic drug level monitoring: Secondary | ICD-10-CM

## 2024-07-06 DIAGNOSIS — R7989 Other specified abnormal findings of blood chemistry: Secondary | ICD-10-CM

## 2024-07-06 MED ORDER — AMLODIPINE BESYLATE 10 MG PO TABS
10.0000 mg | ORAL_TABLET | Freq: Every day | ORAL | 1 refills | Status: DC
  Filled 2024-07-06: qty 90, 90d supply, fill #0

## 2024-07-06 NOTE — Telephone Encounter (Signed)
 Please Mychart or Call Pt with confirmation of last seizure to date, thanks

## 2024-07-06 NOTE — Telephone Encounter (Signed)
 Pt needed to know when his last seizure was, he hasn't been seen in a while he is getting medicaid it will be starting soon, and he will call to get an appointment,

## 2024-07-06 NOTE — Progress Notes (Signed)
 Assessment & Plan:  Michael Hobbs was seen today for hypertension.  Diagnoses and all orders for this visit:  Primary hypertension -     amLODipine  (NORVASC ) 10 MG tablet; Take 1 tablet (10 mg total) by mouth daily. FOR BLOOD PRESSURE -     CMP14+EGFR Hypertension management suboptimal due to non-adherence to amlodipine . Current dose may be inadequate based on blood pressure readings. Importance of medication adherence acknowledged. INCREASED amlodipine  from 5mg  to  10 mg tablet. - Scheduled follow-up in 4 weeks for blood pressure check.   Colon cancer screening -     Fecal occult blood, imunochemical  Therapeutic drug monitoring -     Levetiracetam  level  Abnormal CBC -     CBC with Differential    Patient has been counseled on age-appropriate routine health concerns for screening and prevention. These are reviewed and up-to-date. Referrals have been placed accordingly. Immunizations are up-to-date or declined.    Subjective:   Chief Complaint  Patient presents with   Hypertension   History of Present Illness Michael Hobbs is a 48 year old male with hypertension who presents for medication management and to establish care.  He has a past medical history of Alcohol abuse, HTN  (09/21/2020), Nasal fracture (12/01/2012), Periorbital contusion of left eye (12/01/2012), PNA (10/02/2018), Seizures (HCC), Subarachnoid hematoma (HCC) (12/01/2012), Syncope (12/01/2012), and Thrombocytopenia (09/21/2020).     HTN He has not been taking his blood pressure medication amlodipine  as prescribed. Blood pressure is significantly elevated.   BP Readings from Last 3 Encounters:  07/06/24 (!) 145/90  02/23/24 (!) 161/109  01/03/24 (!) 151/107     He experiences persistent pain in his eyebrow, for which he was previously taking amitriptyline . However, he stopped the medication as it was not effective in alleviating the pain. He is waiting to have this surgically revised through plastic  surgery but at this time he is uninsured.   He discusses issues with his insurance, mentioning a switch from one provider to another and difficulties with coverage, which affected his ability to attend appointments. He plans to visit the Medicaid office to resolve these issues.  He is followed by Dr. Georjean neurology for his seizures. Takes Keppra  as prescribed    Review of Systems  Constitutional:  Negative for fever, malaise/fatigue and weight loss.  HENT: Negative.  Negative for nosebleeds.   Eyes: Negative.  Negative for blurred vision, double vision and photophobia.  Respiratory: Negative.  Negative for cough and shortness of breath.   Cardiovascular: Negative.  Negative for chest pain, palpitations and leg swelling.  Gastrointestinal: Negative.  Negative for heartburn, nausea and vomiting.  Musculoskeletal: Negative.  Negative for myalgias.  Neurological: Negative.  Negative for dizziness, focal weakness, seizures and headaches.  Psychiatric/Behavioral: Negative.  Negative for suicidal ideas.     Past Medical History:  Diagnosis Date   Alcohol abuse    fell in 2006 and had brain bleed   HTN (hypertension) 09/21/2020   Nasal fracture 12/01/2012   Periorbital contusion of left eye 12/01/2012   PNA (pneumonia) 10/02/2018   Seizures (HCC)    Subarachnoid hematoma (HCC) 12/01/2012   Syncope 12/01/2012   Thrombocytopenia 09/21/2020    Past Surgical History:  Procedure Laterality Date   HERNIA REPAIR     right shoulder surgery. Left     Family History  Problem Relation Age of Onset   Stroke Father     Social History Reviewed with no changes to be made today.   Outpatient Medications  Prior to Visit  Medication Sig Dispense Refill   levETIRAcetam  (KEPPRA ) 500 MG tablet Take 1 tablet (500 mg total) by mouth 2 (two) times daily. 60 tablet 2   amitriptyline  (ELAVIL ) 25 MG tablet Take 2 tablets (50 mg total) by mouth Nightly. (Patient not taking: Reported on 07/06/2024)  60 tablet 4   amLODipine  (NORVASC ) 5 MG tablet Take 1 tablet (5 mg total) by mouth daily. (Patient not taking: Reported on 07/06/2024) 90 tablet 0   No facility-administered medications prior to visit.    No Known Allergies     Objective:    BP (!) 145/90 (BP Location: Left Arm, Patient Position: Sitting, Cuff Size: Normal)   Pulse 73   Resp 19   Ht 6' (1.829 m)   Wt 171 lb 3.2 oz (77.7 kg)   SpO2 100%   BMI 23.22 kg/m  Wt Readings from Last 3 Encounters:  07/06/24 171 lb 3.2 oz (77.7 kg)  02/23/24 165 lb 6.4 oz (75 kg)  01/03/24 185 lb (83.9 kg)    Physical Exam Vitals and nursing note reviewed.  Constitutional:      Appearance: He is well-developed.  HENT:     Head: Normocephalic and atraumatic.  Eyes:      Comments: SCAR/DEFORMITY  Cardiovascular:     Rate and Rhythm: Normal rate and regular rhythm.     Heart sounds: Normal heart sounds. No murmur heard.    No friction rub. No gallop.  Pulmonary:     Effort: Pulmonary effort is normal. No tachypnea or respiratory distress.     Breath sounds: Normal breath sounds. No decreased breath sounds, wheezing, rhonchi or rales.  Chest:     Chest wall: No tenderness.  Abdominal:     General: Bowel sounds are normal.     Palpations: Abdomen is soft.  Musculoskeletal:        General: Normal range of motion.     Cervical back: Normal range of motion.  Skin:    General: Skin is warm and dry.  Neurological:     Mental Status: He is alert and oriented to person, place, and time.     Coordination: Coordination normal.  Psychiatric:        Behavior: Behavior normal. Behavior is cooperative.        Thought Content: Thought content normal.        Judgment: Judgment normal.          Patient has been counseled extensively about nutrition and exercise as well as the importance of adherence with medications and regular follow-up. The patient was given clear instructions to go to ER or return to medical center if symptoms  don't improve, worsen or new problems develop. The patient verbalized understanding.   Follow-up: Return in about 4 weeks (around 08/03/2024) for BP check.   Michael LELON Servant, FNP-BC [facility] [place], [place] [phone]   07/06/2024, 1:20 PM

## 2024-07-07 LAB — CMP14+EGFR
ALT: 6 IU/L (ref 0–44)
AST: 15 IU/L (ref 0–40)
Albumin: 4.3 g/dL (ref 4.1–5.1)
Alkaline Phosphatase: 69 IU/L (ref 47–123)
BUN/Creatinine Ratio: 7 — ABNORMAL LOW (ref 9–20)
BUN: 6 mg/dL (ref 6–24)
Bilirubin Total: 0.5 mg/dL (ref 0.0–1.2)
CO2: 25 mmol/L (ref 20–29)
Calcium: 9 mg/dL (ref 8.7–10.2)
Chloride: 99 mmol/L (ref 96–106)
Creatinine, Ser: 0.85 mg/dL (ref 0.76–1.27)
Globulin, Total: 2.9 g/dL (ref 1.5–4.5)
Glucose: 91 mg/dL (ref 70–99)
Potassium: 4.2 mmol/L (ref 3.5–5.2)
Sodium: 138 mmol/L (ref 134–144)
Total Protein: 7.2 g/dL (ref 6.0–8.5)
eGFR: 108 mL/min/1.73 (ref >59)

## 2024-07-07 LAB — CBC WITH DIFFERENTIAL/PLATELET
Basophils Absolute: 0 x10E3/uL (ref 0.0–0.2)
Basos: 1 %
EOS (ABSOLUTE): 0.1 x10E3/uL (ref 0.0–0.4)
Eos: 1 %
Hematocrit: 42.1 % (ref 37.5–51.0)
Hemoglobin: 15.1 g/dL (ref 13.0–17.7)
Immature Grans (Abs): 0 x10E3/uL (ref 0.0–0.1)
Immature Granulocytes: 0 %
Lymphocytes Absolute: 2 x10E3/uL (ref 0.7–3.1)
Lymphs: 38 %
MCH: 38.3 pg — ABNORMAL HIGH (ref 26.6–33.0)
MCHC: 35.9 g/dL — ABNORMAL HIGH (ref 31.5–35.7)
MCV: 107 fL — ABNORMAL HIGH (ref 79–97)
Monocytes Absolute: 0.3 x10E3/uL (ref 0.1–0.9)
Monocytes: 7 %
Neutrophils Absolute: 2.8 x10E3/uL (ref 1.4–7.0)
Neutrophils: 53 %
Platelets: 115 x10E3/uL — ABNORMAL LOW (ref 150–450)
RBC: 3.94 x10E6/uL — ABNORMAL LOW (ref 4.14–5.80)
RDW: 13.1 % (ref 11.6–15.4)
WBC: 5.2 x10E3/uL (ref 3.4–10.8)

## 2024-07-07 LAB — LEVETIRACETAM LEVEL: Levetiracetam Lvl: 8.9 ug/mL — ABNORMAL LOW (ref 10.0–40.0)

## 2024-07-16 ENCOUNTER — Other Ambulatory Visit: Payer: Self-pay

## 2024-07-18 ENCOUNTER — Ambulatory Visit: Payer: Self-pay | Admitting: Nurse Practitioner

## 2024-07-19 NOTE — Telephone Encounter (Signed)
 Michael Hobbs(Self)  PH: [phone]  Lvm requesting a call back.   FYI: Called patient, he stated that he now has insurance with [facility] and will bring ins card to the office to put on file.    He also mentioned that he needs to get a colonoscopy scheduled.

## 2024-07-19 NOTE — Telephone Encounter (Signed)
 Pt advised that he needs to call his PCP to get referral for colonoscopy

## 2024-08-16 ENCOUNTER — Telehealth: Payer: Self-pay | Admitting: Nurse Practitioner

## 2024-08-16 NOTE — Telephone Encounter (Signed)
 Contacted pt phone couldn't be completed as dial no vm sent

## 2024-08-17 ENCOUNTER — Ambulatory Visit: Payer: Self-pay | Attending: Nurse Practitioner | Admitting: Nurse Practitioner

## 2024-08-17 ENCOUNTER — Encounter: Payer: Self-pay | Admitting: Nurse Practitioner

## 2024-08-17 ENCOUNTER — Other Ambulatory Visit: Payer: Self-pay

## 2024-08-17 VITALS — BP 144/92 | HR 66 | Ht 72.0 in | Wt 171.6 lb

## 2024-08-17 DIAGNOSIS — Z1211 Encounter for screening for malignant neoplasm of colon: Secondary | ICD-10-CM

## 2024-08-17 DIAGNOSIS — I10 Essential (primary) hypertension: Secondary | ICD-10-CM

## 2024-08-17 MED ORDER — AMLODIPINE BESYLATE 10 MG PO TABS
10.0000 mg | ORAL_TABLET | Freq: Every day | ORAL | 1 refills | Status: AC
  Filled 2024-08-17: qty 90, 90d supply, fill #0

## 2024-08-17 NOTE — Progress Notes (Signed)
 "  Assessment & Plan:  Michael Hobbs was seen today for hypertension.  Diagnoses and all orders for this visit:  Primary hypertension -     amLODipine  (NORVASC ) 10 MG tablet; Take 1 tablet (10 mg total) by mouth daily. FOR BLOOD PRESSURE Michael Hobbs agrees to starting amlodipine  today  Colon cancer screening -     Ambulatory referral to Gastroenterology    Patient has been counseled on age-appropriate routine health concerns for screening and prevention. These are reviewed and up-to-date. Referrals have been placed accordingly. Immunizations are up-to-date or declined.    Subjective:   Chief Complaint  Patient presents with   Hypertension    Michael Hobbs 48 y.o. male presents to office today for follow up to HTN  Michael Hobbs has a past medical history of Alcohol abuse, HTN  (09/21/2020), Nasal fracture (12/01/2012), Periorbital contusion of left eye (12/01/2012), PNA (10/02/2018), Seizures (HCC), Subarachnoid hematoma (HCC) (12/01/2012), Syncope (12/01/2012), and Thrombocytopenia (09/21/2020).   HTN Michael Hobbs was started on amlodipine  July 06, 2024 for elevated blood pressure readings.  After Michael Hobbs office visit Michael Hobbs was instructed to return in 4 to 6 weeks for repeat blood pressure reading on amlodipine .  Today Michael Hobbs returns and states Michael Hobbs never started amlodipine .  Blood pressure continues elevated.  Michael Hobbs is still drinking alcohol and smoking tobacco but not as frequently per Michael Hobbs report.   BP Readings from Last 3 Encounters:  08/17/24 (!) 144/92  07/06/24 (!) 145/90  02/23/24 (!) 161/109     Review of Systems  Constitutional:  Negative for fever, malaise/fatigue and weight loss.  HENT: Negative.  Negative for nosebleeds.   Eyes: Negative.  Negative for blurred vision, double vision and photophobia.  Respiratory: Negative.  Negative for cough and shortness of breath.   Cardiovascular: Negative.  Negative for chest pain, palpitations and leg swelling.  Gastrointestinal: Negative.  Negative for heartburn, nausea  and vomiting.  Musculoskeletal: Negative.  Negative for myalgias.  Neurological: Negative.  Negative for dizziness, focal weakness, seizures and headaches.  Psychiatric/Behavioral: Negative.  Negative for suicidal ideas.     Past Medical History:  Diagnosis Date   Alcohol abuse    fell in 2006 and had brain bleed   HTN (hypertension) 09/21/2020   Nasal fracture 12/01/2012   Periorbital contusion of left eye 12/01/2012   PNA (pneumonia) 10/02/2018   Seizures (HCC)    Subarachnoid hematoma (HCC) 12/01/2012   Syncope 12/01/2012   Thrombocytopenia 09/21/2020    Past Surgical History:  Procedure Laterality Date   HERNIA REPAIR     right shoulder surgery. Left     Family History  Problem Relation Age of Onset   Stroke Father     Social History Reviewed with no changes to be made today.   Outpatient Medications Prior to Visit  Medication Sig Dispense Refill   levETIRAcetam  (KEPPRA ) 500 MG tablet Take 1 tablet (500 mg total) by mouth 2 (two) times daily. 60 tablet 2   amLODipine  (NORVASC ) 10 MG tablet Take 1 tablet (10 mg total) by mouth daily. FOR BLOOD PRESSURE 90 tablet 1   No facility-administered medications prior to visit.    Allergies[1]     Objective:    BP (!) 144/92 (BP Location: Left Arm, Patient Position: Sitting, Cuff Size: Normal)   Pulse 66   Ht 6' (1.829 m)   Wt 171 lb 9.6 oz (77.8 kg)   SpO2 100%   BMI 23.27 kg/m  Wt Readings from Last 3 Encounters:  08/17/24 171 lb 9.6 oz (77.8  kg)  07/06/24 171 lb 3.2 oz (77.7 kg)  02/23/24 165 lb 6.4 oz (75 kg)    Physical Exam Vitals and nursing note reviewed.  Constitutional:      Appearance: Michael Hobbs is well-developed.  HENT:     Head: Normocephalic and atraumatic.  Cardiovascular:     Rate and Rhythm: Normal rate and regular rhythm.     Heart sounds: Normal heart sounds. No murmur heard.    No friction rub. No gallop.  Pulmonary:     Effort: Pulmonary effort is normal. No tachypnea or respiratory  distress.     Breath sounds: Normal breath sounds. No decreased breath sounds, wheezing, rhonchi or rales.  Chest:     Chest wall: No tenderness.  Musculoskeletal:        General: Normal range of motion.     Cervical back: Normal range of motion.  Skin:    General: Skin is warm and dry.  Neurological:     Mental Status: Michael Hobbs is alert and oriented to person, place, and time.     Coordination: Coordination normal.  Psychiatric:        Behavior: Behavior normal. Behavior is cooperative.        Thought Content: Thought content normal.        Judgment: Judgment normal.          Patient has been counseled extensively about nutrition and exercise as well as the importance of adherence with medications and regular follow-up. The patient was given clear instructions to go to ER or return to medical center if symptoms don't improve, worsen or new problems develop. The patient verbalized understanding.   Follow-up: Return in about 6 weeks (around 09/28/2024) for BP CHECK .   Haze LELON Servant, FNP-BC [facility] [place], [place] [phone]   08/17/2024, 4:49 PM    [1] No Known Allergies  "

## 2024-08-18 ENCOUNTER — Other Ambulatory Visit: Payer: Self-pay

## 2024-08-23 ENCOUNTER — Other Ambulatory Visit: Payer: Self-pay

## 2024-08-27 ENCOUNTER — Encounter: Payer: Self-pay | Admitting: Internal Medicine

## 2024-09-15 ENCOUNTER — Encounter

## 2024-09-19 DEATH — deceased

## 2024-09-21 ENCOUNTER — Telehealth: Payer: Self-pay | Admitting: Nurse Practitioner

## 2024-09-21 NOTE — Telephone Encounter (Signed)
 Patient's mother called back, wants to speak with Dr Theotis, I let her know it will probably be Dr Tyna nurse she speak with She is upset that is was put son the patient's death certificate that he had a seizure

## 2024-09-21 NOTE — Telephone Encounter (Signed)
 Routing to CMA

## 2024-09-21 NOTE — Telephone Encounter (Signed)
 Spoke with Mother. She is aware of the reason for death certificate cause of death

## 2024-09-21 NOTE — Telephone Encounter (Signed)
 Copied from CRM #[id number]. Topic: General - Deceased Patient >> 10-15-2024 11:17 AM Sophia H wrote: Name of caller: Hallie Stacks   Date of death: 2024/10/07   Name of funeral home: [facility] - [place], [place]   Provider that needs to sign form: NP Haze Servant   Timeline for signing: Kurt if they have sent anything over, they advised patients mother that they did but I do not see anything in chart. Mom states they are needing Death certificate signed ASAP.

## 2024-09-22 ENCOUNTER — Ambulatory Visit: Payer: Self-pay | Admitting: Plastic Surgery

## 2024-09-27 ENCOUNTER — Encounter: Admitting: Internal Medicine

## 2024-09-28 ENCOUNTER — Ambulatory Visit: Payer: Self-pay | Admitting: Nurse Practitioner

## 2024-10-28 ENCOUNTER — Ambulatory Visit: Payer: Self-pay | Admitting: Neurology
